# Patient Record
Sex: Male | Born: 1980
Health system: Southern US, Community
[De-identification: ages and names within clinical notes are randomized; demographics above are authoritative.]

## PROBLEM LIST (undated history)

## (undated) DIAGNOSIS — G44229 Chronic tension-type headache, not intractable: Secondary | ICD-10-CM

## (undated) DIAGNOSIS — I10 Essential (primary) hypertension: Secondary | ICD-10-CM

## (undated) HISTORY — PX: NO PAST SURGERIES: SHX2092

## (undated) HISTORY — DX: Chronic tension-type headache, not intractable: G44.229

## (undated) HISTORY — DX: Essential (primary) hypertension: I10

---

## 2004-04-06 DIAGNOSIS — G44229 Chronic tension-type headache, not intractable: Secondary | ICD-10-CM | POA: Insufficient documentation

## 2006-10-04 ENCOUNTER — Ambulatory Visit: Payer: Self-pay | Admitting: Internal Medicine

## 2008-06-07 IMAGING — CR RIGHT HAND - COMPLETE 3+ VIEW
1 series · 3 of 3 positions shown · non-contrast
Comparison: none

REASON FOR EXAM: Injury
COMMENTS:

[Series 1: view not recorded · 0.17mm/px · 3 of 3 slices shown]
[im 1/3]
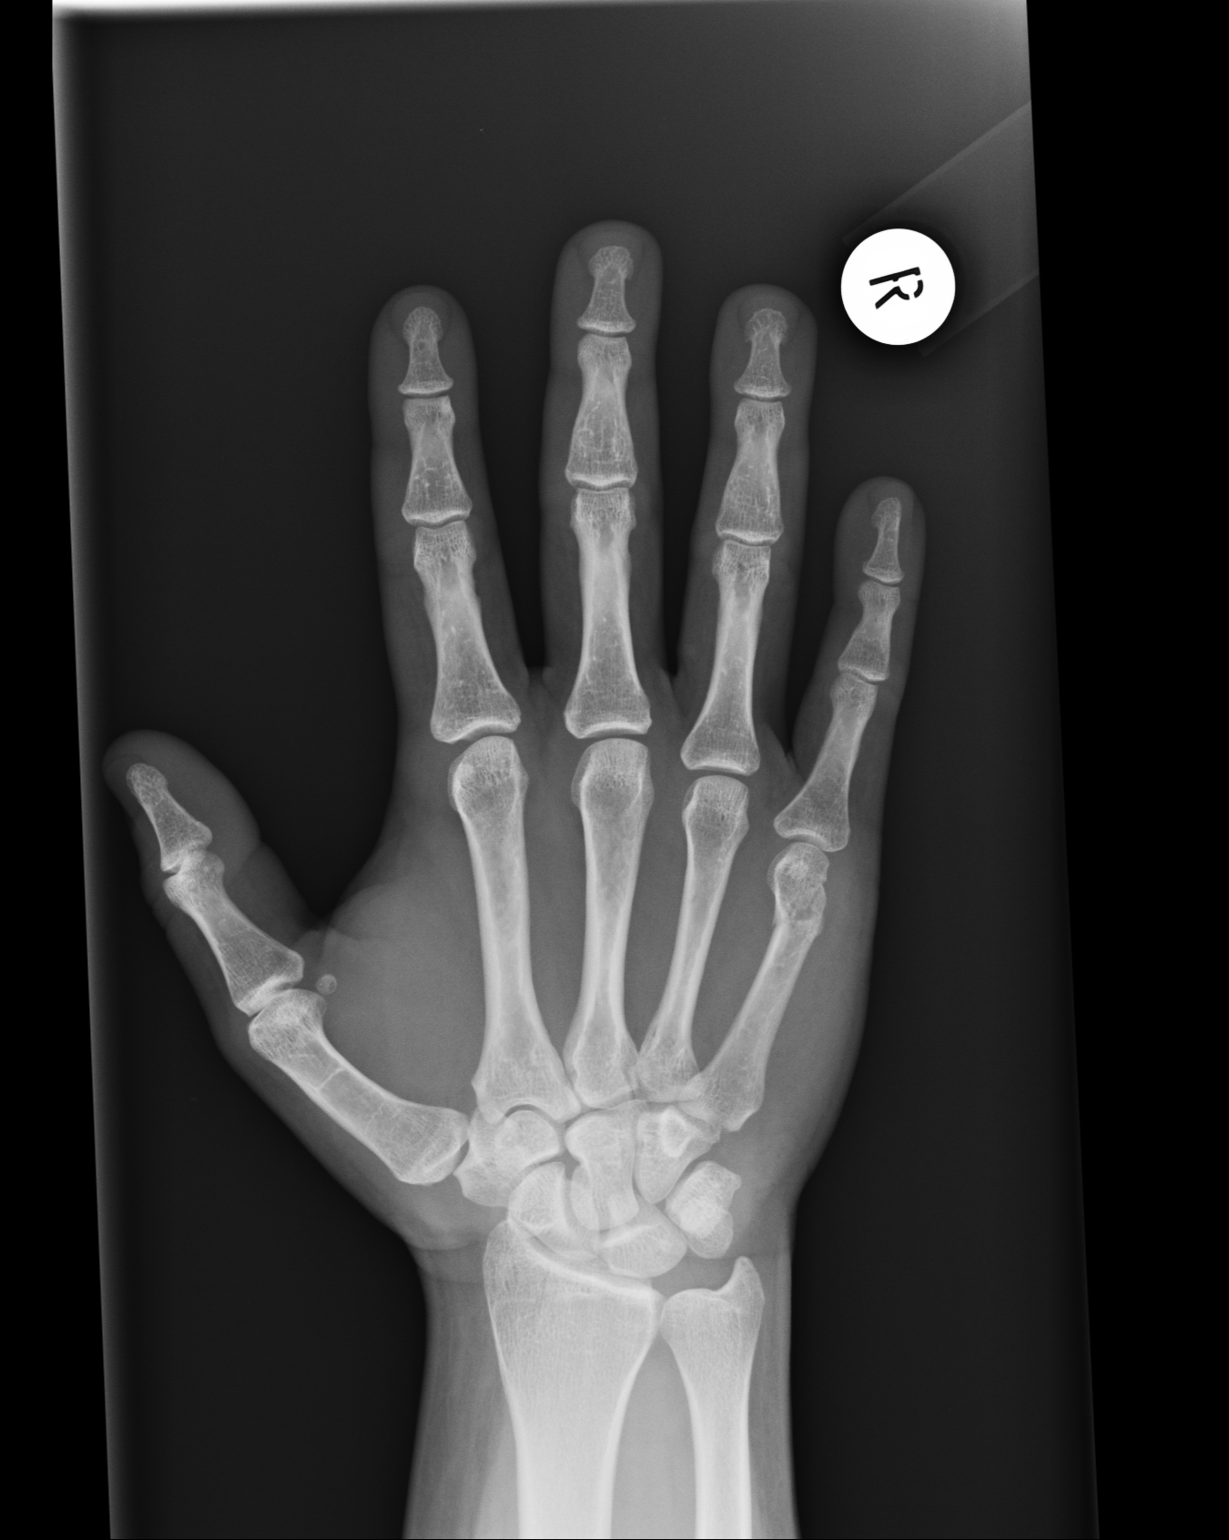
[im 2/3]
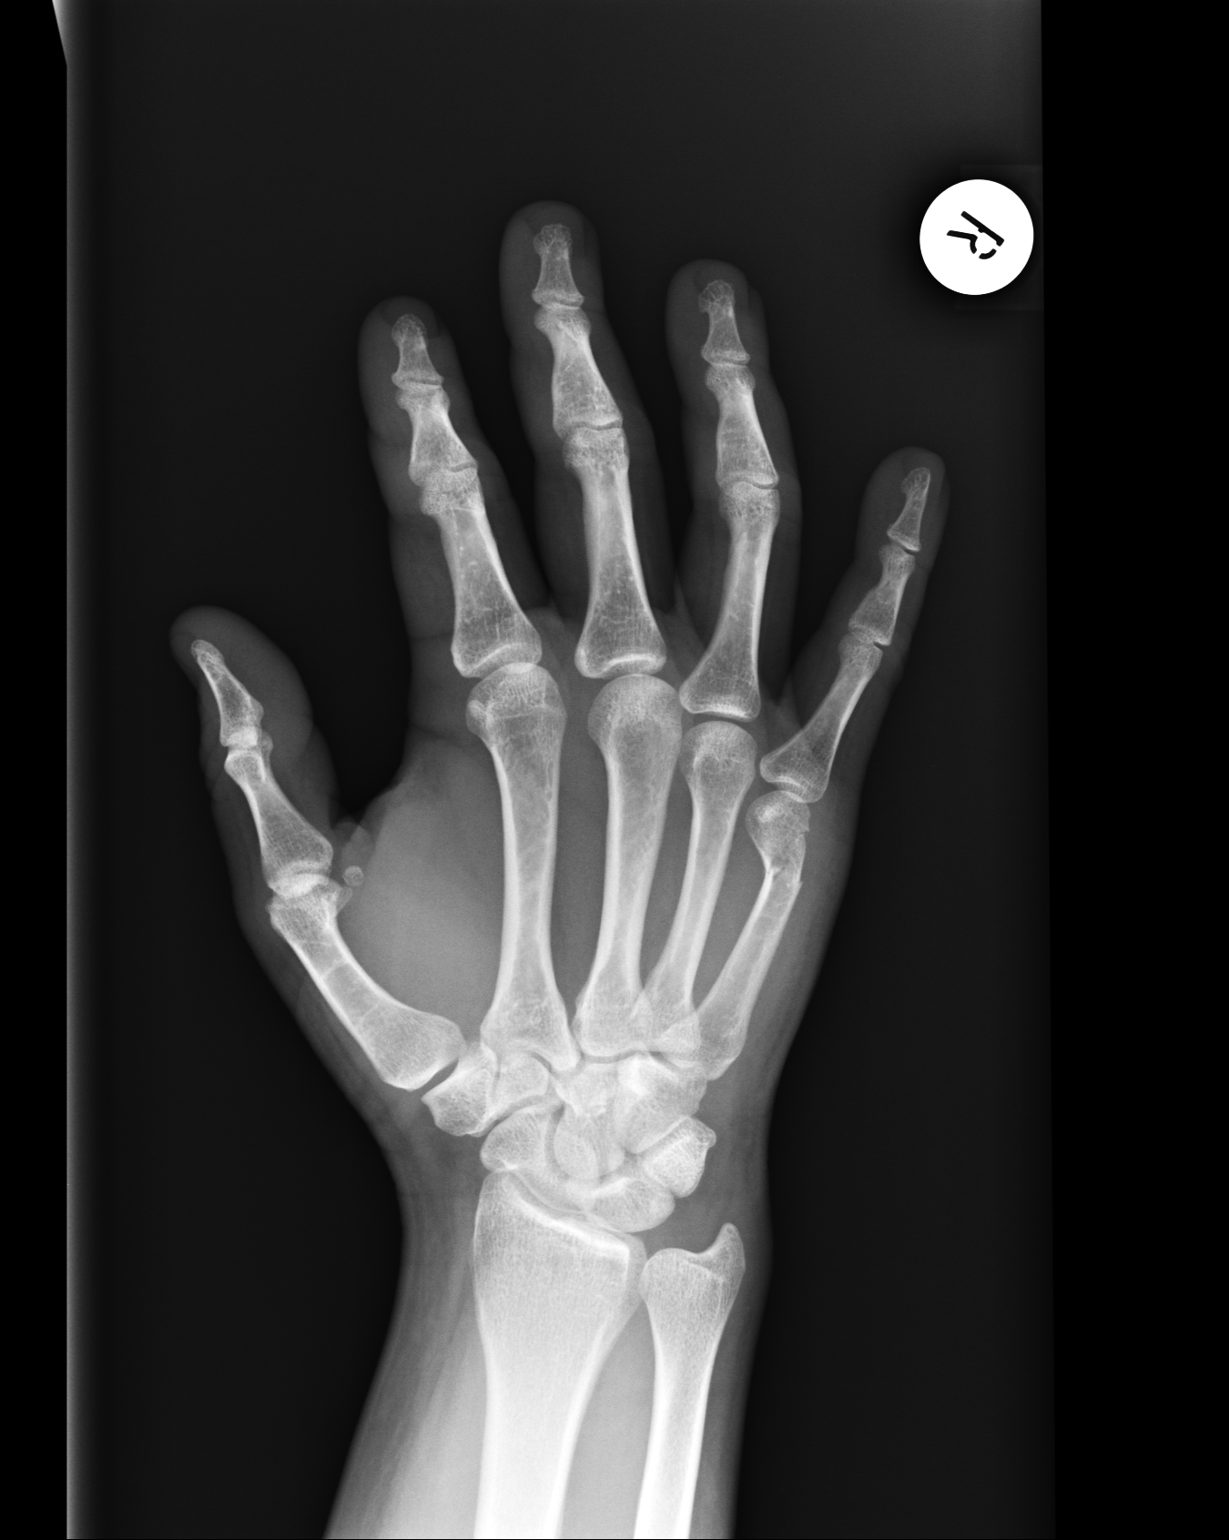
[im 3/3]
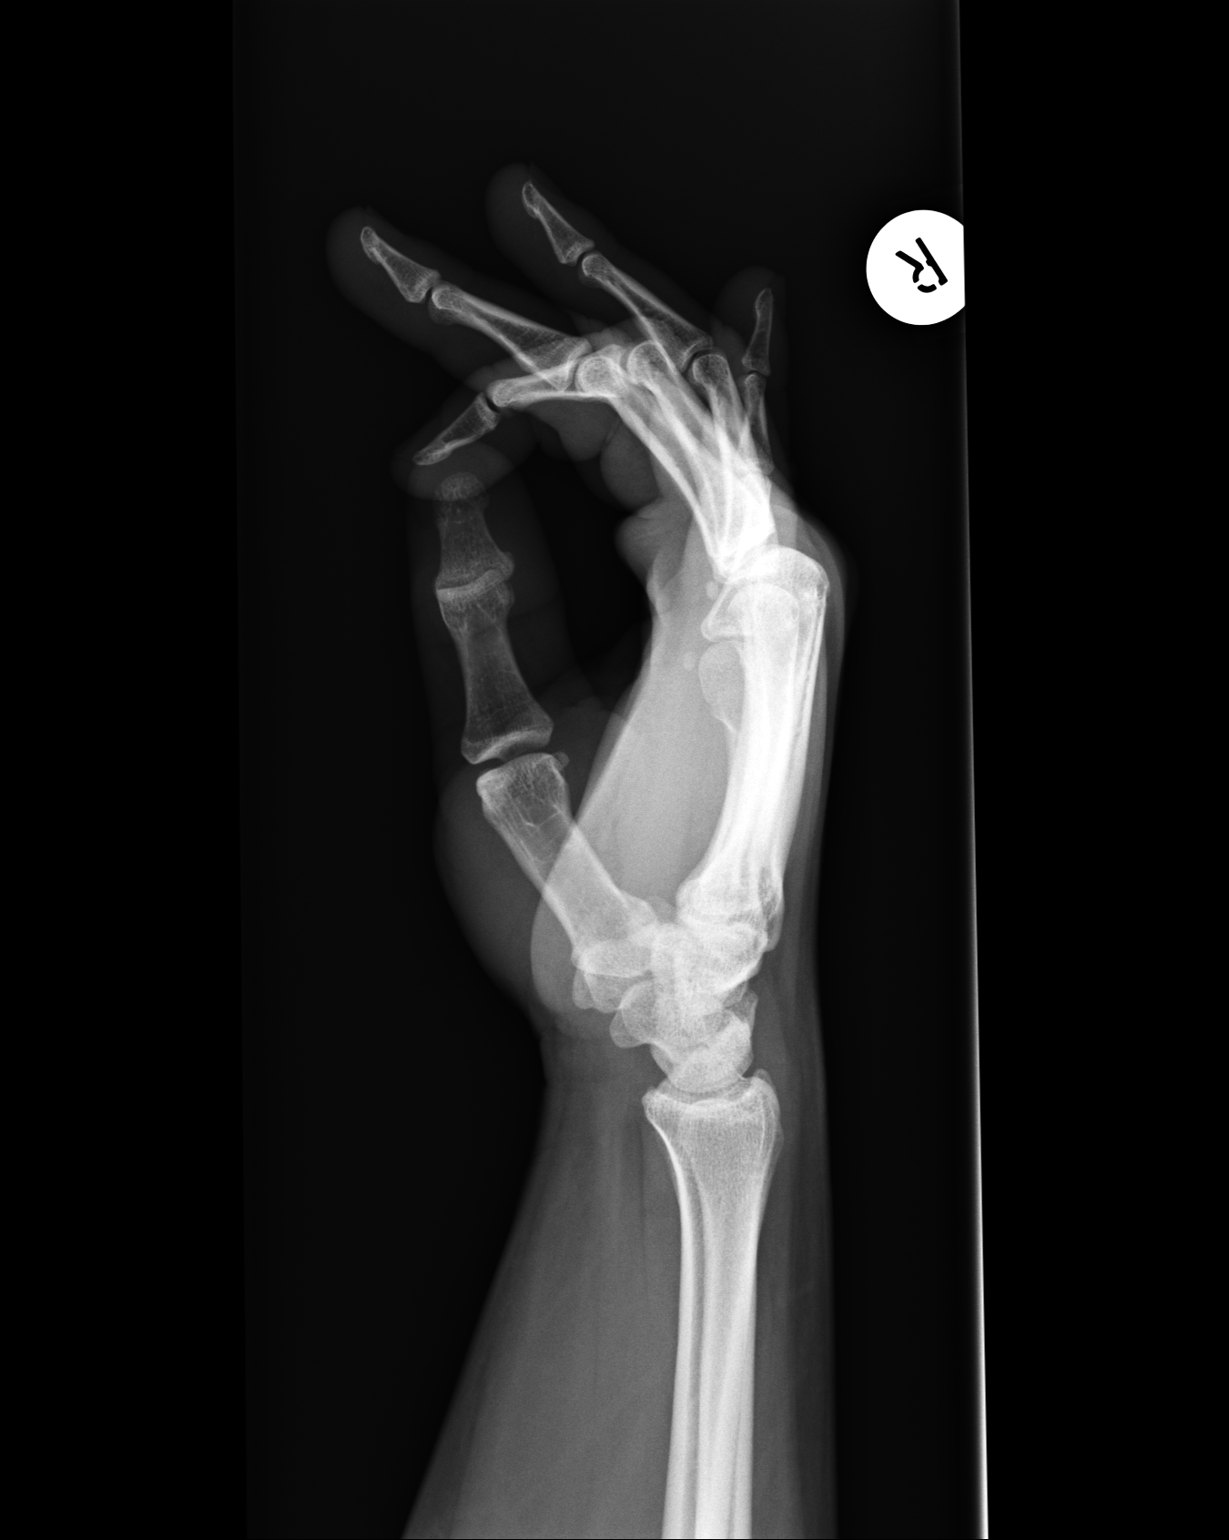

[3 of 3 positions shown; findings below may reference images not displayed]

PROCEDURE:     MDR - MDR HAND RT COMP W/OBLIQUES  - October 04, 2006  [DATE]

RESULT:     Three views were obtained. There is a minimally displaced
fracture of the distal RIGHT, fifth metacarpal. There is slight lateral and
slight volar angulation of the distal fracture component with respect to the
proximal. No other fractures are seen.
IMPRESSION: Fracture of the distal RIGHT, fifth metacarpal.

## 2008-11-02 ENCOUNTER — Ambulatory Visit: Payer: Self-pay | Admitting: Family Medicine

## 2009-09-09 DIAGNOSIS — I10 Essential (primary) hypertension: Secondary | ICD-10-CM | POA: Insufficient documentation

## 2011-04-01 ENCOUNTER — Ambulatory Visit: Payer: Self-pay | Admitting: Family Medicine

## 2014-11-10 ENCOUNTER — Other Ambulatory Visit: Payer: Self-pay | Admitting: Family Medicine

## 2015-04-16 ENCOUNTER — Other Ambulatory Visit: Payer: Self-pay | Admitting: Family Medicine

## 2015-05-21 ENCOUNTER — Other Ambulatory Visit: Payer: Self-pay | Admitting: Family Medicine

## 2015-05-24 DIAGNOSIS — R053 Chronic cough: Secondary | ICD-10-CM | POA: Insufficient documentation

## 2015-05-24 DIAGNOSIS — Z83438 Family history of other disorder of lipoprotein metabolism and other lipidemia: Secondary | ICD-10-CM | POA: Insufficient documentation

## 2015-05-24 DIAGNOSIS — R05 Cough: Secondary | ICD-10-CM | POA: Insufficient documentation

## 2015-05-27 ENCOUNTER — Ambulatory Visit (INDEPENDENT_AMBULATORY_CARE_PROVIDER_SITE_OTHER): Payer: 59 | Admitting: Family Medicine

## 2015-05-27 ENCOUNTER — Ambulatory Visit: Payer: Self-pay | Admitting: Family Medicine

## 2015-05-27 ENCOUNTER — Encounter: Payer: Self-pay | Admitting: Family Medicine

## 2015-05-27 VITALS — BP 124/94 | HR 60 | Temp 98.1°F | Resp 16 | Ht 73.0 in | Wt 316.0 lb

## 2015-05-27 DIAGNOSIS — G44229 Chronic tension-type headache, not intractable: Secondary | ICD-10-CM | POA: Diagnosis not present

## 2015-05-27 DIAGNOSIS — Z83438 Family history of other disorder of lipoprotein metabolism and other lipidemia: Secondary | ICD-10-CM

## 2015-05-27 DIAGNOSIS — I10 Essential (primary) hypertension: Secondary | ICD-10-CM

## 2015-05-27 DIAGNOSIS — Z8349 Family history of other endocrine, nutritional and metabolic diseases: Secondary | ICD-10-CM | POA: Diagnosis not present

## 2015-05-27 MED ORDER — LISINOPRIL 10 MG PO TABS: 10.0000 mg | ORAL_TABLET | Freq: Every day | ORAL | Status: DC

## 2015-05-27 MED ORDER — PROPRANOLOL HCL ER 60 MG PO CP24: 60.0000 mg | ORAL_CAPSULE | Freq: Every day | ORAL | Status: DC

## 2015-05-27 NOTE — Assessment & Plan Note (Signed)
Controlled with propranolol.

## 2015-05-27 NOTE — Progress Notes (Signed)
Subjective:     Patient ID: George Bennett, male   DOB: 01/15/1981, 35 y.o.   MRN: [id number]  HPI  Chief Complaint  Patient presents with  . Hypertension    Patient comes in office today for follow up from 04/20/14, last office visit patients condition was stable with a blood pressure in house of 118/74. 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   Review of Systems  Constitutional:       Weight is decreased 8# from prior office visit of a year ago.  Respiratory: Negative for shortness of breath.   Cardiovascular: Negative for chest pain and palpitations.       Objective:   Physical Exam  Constitutional: He appears well-developed and well-nourished. No distress.  Cardiovascular: Normal rate and regular rhythm.   Pulmonary/Chest: Breath sounds normal.  Musculoskeletal: He exhibits no edema (of lower extremities).       Assessment:    1. Benign hypertension - Comprehensive metabolic panel - lisinopril (PRINIVIL,ZESTRIL) 10 MG tablet; Take 1 tablet (10 mg total) by mouth daily.  Dispense: 30 tablet; Refill: 5 - propranolol ER (INDERAL LA) 60 MG 24 hr capsule; Take 1 capsule (60 mg total) by mouth daily.  Dispense: 30 capsule; Refill: 5  2. Family history of hyperlipidemia - Lipid panel    Plan:    Further f/u pending lab work.

## 2015-05-27 NOTE — Patient Instructions (Signed)
We will call you with the lab results. 

## 2015-05-28 ENCOUNTER — Telehealth: Payer: Self-pay

## 2015-05-28 LAB — COMPREHENSIVE METABOLIC PANEL
A/G RATIO: 1.3 (ref 1.1–2.5)
ALT: 20 IU/L (ref 0–44)
AST: 18 IU/L (ref 0–40)
Albumin: 4.3 g/dL (ref 3.5–5.5)
Alkaline Phosphatase: 91 IU/L (ref 39–117)
BILIRUBIN TOTAL: 0.7 mg/dL (ref 0.0–1.2)
BUN/Creatinine Ratio: 18 (ref 8–19)
BUN: 18 mg/dL (ref 6–20)
CALCIUM: 9.6 mg/dL (ref 8.7–10.2)
CHLORIDE: 99 mmol/L (ref 96–106)
CO2: 25 mmol/L (ref 18–29)
Creatinine, Ser: 1.02 mg/dL (ref 0.76–1.27)
GFR calc non Af Amer: 95 mL/min/{1.73_m2} (ref >59)
GFR, EST AFRICAN AMERICAN: 110 mL/min/{1.73_m2} (ref >59)
GLOBULIN, TOTAL: 3.3 g/dL (ref 1.5–4.5)
Glucose: 90 mg/dL (ref 65–99)
POTASSIUM: 4.9 mmol/L (ref 3.5–5.2)
SODIUM: 140 mmol/L (ref 134–144)
Total Protein: 7.6 g/dL (ref 6.0–8.5)

## 2015-05-28 LAB — LIPID PANEL
Chol/HDL Ratio: 2.8 ratio units (ref 0.0–5.0)
Cholesterol, Total: 148 mg/dL (ref 100–199)
HDL: 53 mg/dL (ref >39)
LDL Calculated: 79 mg/dL (ref 0–99)
Triglycerides: 81 mg/dL (ref 0–149)
VLDL Cholesterol Cal: 16 mg/dL (ref 5–40)

## 2015-05-28 NOTE — Telephone Encounter (Signed)
Unable to reach patient at this time, home line and cell number are both the same. Voicemail box is full will try contacting patient again at a later time. KW

## 2015-05-28 NOTE — Telephone Encounter (Signed)
Pt is returning call.  ZO#[phone]/[id number]

## 2015-05-28 NOTE — Telephone Encounter (Signed)
Patient has been advised. KW 

## 2015-05-28 NOTE — Telephone Encounter (Signed)
-----   Message from Anola Gurney, [place] sent at 05/28/2015  7:40 AM EST ----- Labs are excellent with a low risk cholesterol profile.

## 2015-12-21 ENCOUNTER — Ambulatory Visit (INDEPENDENT_AMBULATORY_CARE_PROVIDER_SITE_OTHER): Payer: 59 | Admitting: Family Medicine

## 2015-12-21 ENCOUNTER — Encounter: Payer: Self-pay | Admitting: Family Medicine

## 2015-12-21 VITALS — BP 132/68 | HR 78 | Temp 98.3°F | Resp 16 | Wt 321.0 lb

## 2015-12-21 DIAGNOSIS — S46311A Strain of muscle, fascia and tendon of triceps, right arm, initial encounter: Secondary | ICD-10-CM | POA: Diagnosis not present

## 2015-12-21 DIAGNOSIS — I10 Essential (primary) hypertension: Secondary | ICD-10-CM

## 2015-12-21 MED ORDER — LISINOPRIL 10 MG PO TABS: 10.0000 mg | ORAL_TABLET | Freq: Every day | ORAL | 5 refills | Status: DC

## 2015-12-21 MED ORDER — PROPRANOLOL HCL ER 60 MG PO CP24: 60.0000 mg | ORAL_CAPSULE | Freq: Every day | ORAL | 5 refills | Status: DC

## 2015-12-21 MED ORDER — MELOXICAM 15 MG PO TABS: 15.0000 mg | ORAL_TABLET | Freq: Every day | ORAL | 0 refills | Status: DC

## 2015-12-21 NOTE — Patient Instructions (Signed)
Discussed use of cold compresses for 20 minutes several x day/at the end of your work shift. Add Tylenol extra strength 500 mg. Two pills 3 x day (do not exceed 3000 mg./day) to the prescribed antiinflammatory.

## 2015-12-21 NOTE — Progress Notes (Signed)
Subjective:     Patient ID: George Bennett, male   DOB: 02/14/1981, 35 y.o.   MRN: [id number]  HPI   Review of Systems     Objective:   Physical Exam     Assessment:        Plan:

## 2015-12-21 NOTE — Progress Notes (Signed)
Subjective:     Patient ID: George Bennett, male   DOB: 07/16/1980, 35 y.o.   MRN: [id number]  HPI  Chief Complaint  Patient presents with  . Elbow Pain    Patient comes in office today with complaints of right elbow pain that began last week, patient states that he is unable to straighten his right arm. 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 States right arm started getting progressively sore last week to the point he can not extend it fully. He has continued to work despite this. Has not been taking medication. Also wishes refill on his bp medication.   Review of Systems  Respiratory: Negative for shortness of breath.   Cardiovascular: Negative for chest pain and palpitations.       Objective:   Physical Exam  Constitutional: He appears well-developed and well-nourished. No distress.  Cardiovascular: Normal rate and regular rhythm.   Pulmonary/Chest: Breath sounds normal.  Musculoskeletal: He exhibits no edema. Tenderness: of lower extremities.  Right grip/EF/EE 5/5. Tender at his elbow at insertion of his triceps tendon       Assessment:    1. Triceps strain, right, initial encounter - meloxicam (MOBIC) 15 MG tablet; Take 1 tablet (15 mg total) by mouth daily.  Dispense: 30 tablet; Refill: 0  2. Benign hypertension - lisinopril (PRINIVIL,ZESTRIL) 10 MG tablet; Take 1 tablet (10 mg total) by mouth daily.  Dispense: 30 tablet; Refill: 5 - propranolol ER (INDERAL LA) 60 MG 24 hr capsule; Take 1 capsule (60 mg total) by mouth daily.  Dispense: 30 capsule; Refill: 5     Plan:    Rest/ice and scheduled extra strength Tylenol in addition to meloxicam. He can take time off if required.

## 2016-01-17 ENCOUNTER — Other Ambulatory Visit: Payer: Self-pay | Admitting: Family Medicine

## 2016-01-17 DIAGNOSIS — S46311A Strain of muscle, fascia and tendon of triceps, right arm, initial encounter: Secondary | ICD-10-CM

## 2016-01-17 NOTE — Telephone Encounter (Signed)
Provider is out of office can you please review. KW

## 2016-06-01 DIAGNOSIS — S76192A Other specified injury of left quadriceps muscle, fascia and tendon, initial encounter: Secondary | ICD-10-CM | POA: Insufficient documentation

## 2016-06-09 DIAGNOSIS — S76912A Strain of unspecified muscles, fascia and tendons at thigh level, left thigh, initial encounter: Secondary | ICD-10-CM | POA: Insufficient documentation

## 2016-07-18 ENCOUNTER — Other Ambulatory Visit: Payer: Self-pay | Admitting: Family Medicine

## 2016-07-18 DIAGNOSIS — I10 Essential (primary) hypertension: Secondary | ICD-10-CM

## 2016-08-15 ENCOUNTER — Other Ambulatory Visit: Payer: Self-pay | Admitting: Family Medicine

## 2016-08-15 DIAGNOSIS — I10 Essential (primary) hypertension: Secondary | ICD-10-CM

## 2016-11-21 ENCOUNTER — Other Ambulatory Visit: Payer: Self-pay | Admitting: Family Medicine

## 2016-11-21 DIAGNOSIS — I10 Essential (primary) hypertension: Secondary | ICD-10-CM

## 2017-03-14 ENCOUNTER — Other Ambulatory Visit: Payer: Self-pay | Admitting: Family Medicine

## 2017-03-14 DIAGNOSIS — I10 Essential (primary) hypertension: Secondary | ICD-10-CM

## 2017-06-25 ENCOUNTER — Other Ambulatory Visit: Payer: Self-pay | Admitting: Family Medicine

## 2017-06-25 DIAGNOSIS — I10 Essential (primary) hypertension: Secondary | ICD-10-CM

## 2017-08-09 ENCOUNTER — Encounter: Payer: Self-pay | Admitting: Family Medicine

## 2017-08-09 ENCOUNTER — Ambulatory Visit: Payer: 59 | Admitting: Family Medicine

## 2017-08-09 VITALS — BP 134/92 | HR 69 | Temp 97.7°F | Resp 16 | Wt 339.4 lb

## 2017-08-09 DIAGNOSIS — Z83438 Family history of other disorder of lipoprotein metabolism and other lipidemia: Secondary | ICD-10-CM | POA: Diagnosis not present

## 2017-08-09 DIAGNOSIS — R140 Abdominal distension (gaseous): Secondary | ICD-10-CM

## 2017-08-09 DIAGNOSIS — I10 Essential (primary) hypertension: Secondary | ICD-10-CM

## 2017-08-09 DIAGNOSIS — R197 Diarrhea, unspecified: Secondary | ICD-10-CM | POA: Diagnosis not present

## 2017-08-09 MED ORDER — DICYCLOMINE HCL 20 MG PO TABS
20.0000 mg | ORAL_TABLET | Freq: Three times a day (TID) | ORAL | 1 refills | Status: DC
Start: 2017-08-09 — End: 2021-04-28

## 2017-08-09 NOTE — Progress Notes (Signed)
  Subjective:     Patient ID: George Bennett, male   DOB: 12/31/1980, 37 y.o.   MRN: [id number] Chief Complaint  Patient presents with  . Diarrhea    Patient comes in offcie today to address intermittent symptoms of abdominap pain, bloating and loose stools for the past year. Patient states that he has abdominal pain and loose stools usually after most meals, patient states that he has been having episodes at least 2x a week as of recent. Patient denies taking any otc medication.    HPI States his previous bowel pattern was once daily. Localizes discomfort to his upper abdominal area. Currently on FMLA (through 6/30) helping take care of his two premature twin daughters who are now at [facility] after transferring from [facility]. Still works as a Multimedia programmer. Also asks about weight loss surgery. His father is a patient here and has had the surgery.  Review of Systems     Objective:   Physical Exam  Constitutional: He appears well-developed and well-nourished. No distress.  Abdominal: Soft. There is no tenderness.       Assessment:    1. Postprandial bloating: FODMAP diet sheet provided  2. Diarrhea, unspecified type: Try Bentyl  3. Benign hypertension - Comprehensive metabolic panel  4. Family history of hyperlipidemia - Lipid panel  5. Morbid obesity (HCC) - Comprehensive metabolic panel - Lipid panel - T4, free - TSH    Plan:    Phone f/u in two week and after lab results. [facility]  for[facility] dietary information once labs reviewed.

## 2017-08-09 NOTE — Patient Instructions (Addendum)
Let me know how you are doing in two weeks. We will call you with the lab results.

## 2017-08-13 ENCOUNTER — Other Ambulatory Visit: Payer: Self-pay | Admitting: Family Medicine

## 2017-08-13 ENCOUNTER — Telehealth: Payer: Self-pay

## 2017-08-13 LAB — LIPID PANEL
CHOL/HDL RATIO: 3.8 ratio (ref 0.0–5.0)
Cholesterol, Total: 147 mg/dL (ref 100–199)
HDL: 39 mg/dL — AB (ref >39)
LDL Calculated: 86 mg/dL (ref 0–99)
TRIGLYCERIDES: 108 mg/dL (ref 0–149)
VLDL CHOLESTEROL CAL: 22 mg/dL (ref 5–40)

## 2017-08-13 LAB — COMPREHENSIVE METABOLIC PANEL
ALT: 27 IU/L (ref 0–44)
AST: 17 IU/L (ref 0–40)
Albumin/Globulin Ratio: 1.3 (ref 1.2–2.2)
Albumin: 4.2 g/dL (ref 3.5–5.5)
Alkaline Phosphatase: 94 IU/L (ref 39–117)
BUN/Creatinine Ratio: 19 (ref 9–20)
BUN: 19 mg/dL (ref 6–20)
Bilirubin Total: 0.6 mg/dL (ref 0.0–1.2)
CALCIUM: 9 mg/dL (ref 8.7–10.2)
CO2: 24 mmol/L (ref 20–29)
CREATININE: 1 mg/dL (ref 0.76–1.27)
Chloride: 100 mmol/L (ref 96–106)
GFR, EST AFRICAN AMERICAN: 111 mL/min/{1.73_m2} (ref >59)
GFR, EST NON AFRICAN AMERICAN: 96 mL/min/{1.73_m2} (ref >59)
GLUCOSE: 86 mg/dL (ref 65–99)
Globulin, Total: 3.2 g/dL (ref 1.5–4.5)
Potassium: 4.5 mmol/L (ref 3.5–5.2)
Sodium: 139 mmol/L (ref 134–144)
Total Protein: 7.4 g/dL (ref 6.0–8.5)

## 2017-08-13 LAB — TSH: TSH: 2.53 u[IU]/mL (ref 0.450–4.500)

## 2017-08-13 LAB — T4, FREE: Free T4: 1.31 ng/dL (ref 0.82–1.77)

## 2017-08-13 NOTE — Telephone Encounter (Signed)
-----   Message from Anola Gurney, [place] sent at 08/13/2017  7:25 AM EDT ----- Labs look great with low cholesterol and normal sugar. Do you wish a referral to nutritionist for weight loss diet and advice?

## 2017-08-13 NOTE — Telephone Encounter (Signed)
[facility] to [facility] in [facility].

## 2017-08-13 NOTE — Telephone Encounter (Signed)
Patient was advised he states that he would like referral to nutritionist.KW

## 2017-08-22 ENCOUNTER — Other Ambulatory Visit: Payer: Self-pay | Admitting: Family Medicine

## 2017-08-22 DIAGNOSIS — I10 Essential (primary) hypertension: Secondary | ICD-10-CM

## 2017-08-25 ENCOUNTER — Telehealth: Payer: Self-pay | Admitting: Family Medicine

## 2017-08-25 ENCOUNTER — Other Ambulatory Visit: Payer: Self-pay | Admitting: Family Medicine

## 2017-08-25 DIAGNOSIS — R197 Diarrhea, unspecified: Secondary | ICD-10-CM

## 2017-08-25 NOTE — Telephone Encounter (Signed)
Patient notified to have labs done for Celiac disease. Advise patient to come up to lab and get it done with our lab tech.

## 2017-08-25 NOTE — Telephone Encounter (Signed)
Pt stated that he Saw Bob for OV on 08/09/17 and was advised to call back to update how he was feeling. Pt stated that he has been taking dicyclomine (BENTYL) 20 MG tablet as directed and also changed the food he eats as Nadine Counts suggested. Pt stated that he is feeling better and symptoms have improved but he thinks it might be bread that was causing his discomfort. Please advise. Thanks TNP

## 2017-08-25 NOTE — Telephone Encounter (Signed)
I have sent in lab order to screen for gluten sensitivity (celiac disease)

## 2017-08-25 NOTE — Telephone Encounter (Signed)
Called patient back but mailbox was full.

## 2017-09-02 ENCOUNTER — Telehealth: Payer: Self-pay

## 2017-09-02 ENCOUNTER — Other Ambulatory Visit: Payer: Self-pay | Admitting: Family Medicine

## 2017-09-02 DIAGNOSIS — R197 Diarrhea, unspecified: Secondary | ICD-10-CM

## 2017-09-02 LAB — CELIAC AB TTG DGP TIGA
Antigliadin Abs, IgA: 10 units (ref 0–19)
GLIADIN IGG: 4 U (ref 0–19)
IGA/IMMUNOGLOBULIN A, SERUM: 420 mg/dL — AB (ref 90–386)
Tissue Transglut Ab: 5 U/mL (ref 0–5)

## 2017-09-02 NOTE — Telephone Encounter (Signed)
Referral in progress. 

## 2017-09-02 NOTE — Telephone Encounter (Signed)
-----   Message from Anola Gurney, [place] sent at 09/02/2017  7:22 AM EDT ----- No gluten allergy.

## 2017-09-02 NOTE — Telephone Encounter (Signed)
Patient advised he states what should he do now? Patient wants to know if he needs to come back to office for follow up? KW

## 2017-09-02 NOTE — Telephone Encounter (Signed)
Unable to reach patient voicemail box is full will try and reach patient again at a later time. KW

## 2017-09-02 NOTE — Telephone Encounter (Signed)
Patient advised he states medication is not helping please proceed with referral for G.I.. George Bennett

## 2017-09-02 NOTE — Telephone Encounter (Signed)
Continue FODMAP diet and dicyclomine as needed. If this has not helped using consistently for two weeks will send to a g.i. Specialist.

## 2017-10-25 ENCOUNTER — Ambulatory Visit: Payer: Self-pay | Admitting: Gastroenterology

## 2017-10-25 ENCOUNTER — Encounter: Payer: Self-pay | Admitting: *Deleted

## 2017-11-15 ENCOUNTER — Other Ambulatory Visit: Payer: Self-pay | Admitting: Family Medicine

## 2017-11-15 DIAGNOSIS — I10 Essential (primary) hypertension: Secondary | ICD-10-CM

## 2018-01-10 ENCOUNTER — Other Ambulatory Visit: Payer: Self-pay | Admitting: Family Medicine

## 2018-01-10 DIAGNOSIS — I10 Essential (primary) hypertension: Secondary | ICD-10-CM

## 2018-02-08 ENCOUNTER — Other Ambulatory Visit: Payer: Self-pay | Admitting: Family Medicine

## 2018-02-08 DIAGNOSIS — I10 Essential (primary) hypertension: Secondary | ICD-10-CM

## 2018-07-13 ENCOUNTER — Other Ambulatory Visit: Payer: Self-pay | Admitting: Family Medicine

## 2018-07-13 DIAGNOSIS — I10 Essential (primary) hypertension: Secondary | ICD-10-CM

## 2018-07-13 NOTE — Telephone Encounter (Signed)
Patient has not been seen since 08/09/2017. Please advise

## 2018-07-13 NOTE — Telephone Encounter (Signed)
Patient scheduled for a e-visit on 07/14/2018.

## 2018-07-13 NOTE — Telephone Encounter (Signed)
Let's offer evisit tomorrow with drive up BP check ahead of time unless he has a BP cuff at home

## 2018-07-13 NOTE — Telephone Encounter (Signed)
[facility] faxed refill request for the following medications:  lisinopril (PRINIVIL,ZESTRIL) 10 MG tablet  90 day supply  LOV: 08/09/2017 with Nadine Counts. Pt was seeing Nadine Counts and hasn't scheduled a F/U with new provider. Please advise. Thanks TNP

## 2018-07-13 NOTE — Progress Notes (Addendum)
Patient: George Bennett Male    DOB: Nov 02, 1980   38 y.o.   MRN: [id number] Visit Date: 07/14/2018  Today's Provider: Shirlee Latch, George Bennett   Chief Complaint  Patient presents with  . Hypertension   Subjective:    Virtual Visit via Video Note  I connected with George Bennett on 07/14/18 at  9:00 AM EDT by a video enabled telemedicine application and verified that I am speaking with the correct person using two identifiers.   I discussed the limitations of evaluation and management by telemedicine and the availability of in person appointments. The patient expressed understanding and agreed to proceed.   Patient location: home Provider location: home office Persons involved in the visit: patient, provider    HPI  Hypertension, follow-up:  BP Readings from Last 3 Encounters:  07/14/18 132/88  08/09/17 (!) 134/92  12/21/15 132/68    He was last seen for hypertension 1 years ago.  BP at that visit was 134/92. Management changes since that visit include no changes. He reports good compliance with treatment. He is not having side effects.  He is not exercising regularly, just walks a lot for work. He is adherent to low salt diet.   Outside blood pressures are being checked periodically. He is experiencing none.  Patient denies chest pain, chest pressure/discomfort, claudication, dyspnea, exertional chest pressure/discomfort, fatigue, irregular heart beat, lower extremity edema, near-syncope, orthopnea, palpitations, paroxysmal nocturnal dyspnea, syncope and tachypnea.   Cardiovascular risk factors include hypertension, male gender and obesity (BMI >= 30 kg/m2).  Use of agents associated with hypertension: none.     Weight trend: stable Wt Readings from Last 3 Encounters:  08/09/17 (!) 339 lb 6.4 oz (154 kg)  12/21/15 (!) 321 lb (145.6 kg)  05/27/15 (!) 316 lb (143.3 kg)    ------------------------------------------------------------------------  Allergies   Allergen Reactions  . Darvon  [Propoxyphene]   . Sulfa Antibiotics      Current Outpatient Medications:  .  dicyclomine (BENTYL) 20 MG tablet, Take 1 tablet (20 mg total) by mouth 3 (three) times daily before meals. As needed for bloating/diarrhea, Disp: 28 tablet, Rfl: 1 .  lisinopril (PRINIVIL,ZESTRIL) 10 MG tablet, Take 1 tablet (10 mg total) by mouth daily., Disp: 90 tablet, Rfl: 1 .  propranolol ER (INDERAL LA) 60 MG 24 hr capsule, Take 1 capsule (60 mg total) by mouth daily., Disp: 90 capsule, Rfl: 1  Review of Systems  Constitutional: Negative.   Respiratory: Negative.   Cardiovascular: Negative.   Musculoskeletal: Negative.     Social History   Tobacco Use  . Smoking status: Never Smoker  . Smokeless tobacco: Never Used  Substance Use Topics  . Alcohol use: No    Alcohol/week: 0.0 standard drinks      Objective:   BP 132/88 (BP Location: Left Arm)  Vitals:   07/14/18 0902  BP: 132/88     Physical Exam Vitals signs reviewed.  Constitutional:      General: He is not in acute distress.    Appearance: He is obese.  HENT:     Head: Normocephalic and atraumatic.  Pulmonary:     Effort: Pulmonary effort is normal. No respiratory distress.  Neurological:     Mental Status: He is alert and oriented to person, place, and time.  Psychiatric:        Mood and Affect: Mood normal.        Behavior: Behavior normal.         Assessment & Plan  I discussed the assessment and treatment plan with the patient. The patient was provided an opportunity to ask questions and all were answered. The patient agreed with the plan and demonstrated an understanding of the instructions.   The patient was advised to call back or seek an in-person evaluation if the symptoms worsen or if the condition fails to improve as anticipated.  Problem List Items Addressed This Visit      Cardiovascular and Mediastinum   Benign hypertension - Primary    Well controlled on reading today  Discussed goal of <140/90 Continue lisinopril and propranolol at current doses Recheck labs at next visit - as unable to get currently due to pandemic F/u in 6 months or sooner if needed      Relevant Medications   propranolol ER (INDERAL LA) 60 MG 24 hr capsule   lisinopril (PRINIVIL,ZESTRIL) 10 MG tablet       Return in about 6 months (around 01/13/2019) for CPE.   The entirety of the information documented in the History of Present Illness, Review of Systems and Physical Exam were personally obtained by me. Portions of this information were initially documented by George Bennett and George Bennett, CMA and reviewed by me for thoroughness and accuracy.    George Bennett, George Schedler Bennett, George Bennett, George Bennett [facility] 07/14/2018 9:18 AM

## 2018-07-14 ENCOUNTER — Encounter: Payer: Self-pay | Admitting: Family Medicine

## 2018-07-14 ENCOUNTER — Ambulatory Visit (INDEPENDENT_AMBULATORY_CARE_PROVIDER_SITE_OTHER): Payer: Managed Care, Other (non HMO) | Admitting: Family Medicine

## 2018-07-14 VITALS — BP 132/88

## 2018-07-14 DIAGNOSIS — I10 Essential (primary) hypertension: Secondary | ICD-10-CM

## 2018-07-14 MED ORDER — PROPRANOLOL HCL ER 60 MG PO CP24: 60.0000 mg | ORAL_CAPSULE | Freq: Every day | ORAL | 1 refills | Status: DC

## 2018-07-14 MED ORDER — LISINOPRIL 10 MG PO TABS: 10.0000 mg | ORAL_TABLET | Freq: Every day | ORAL | 1 refills | Status: DC

## 2018-07-14 NOTE — Assessment & Plan Note (Signed)
Well controlled on reading today Discussed goal of <140/90 Continue lisinopril and propranolol at current doses Recheck labs at next visit - as unable to get currently due to pandemic F/u in 6 months or sooner if needed

## 2018-11-14 ENCOUNTER — Ambulatory Visit (INDEPENDENT_AMBULATORY_CARE_PROVIDER_SITE_OTHER): Payer: Managed Care, Other (non HMO) | Admitting: Family Medicine

## 2018-11-14 ENCOUNTER — Other Ambulatory Visit: Payer: Self-pay

## 2018-11-14 ENCOUNTER — Encounter: Payer: Self-pay | Admitting: Family Medicine

## 2018-11-14 VITALS — BP 116/66 | HR 74 | Temp 98.8°F | Ht 72.0 in | Wt 332.5 lb

## 2018-11-14 DIAGNOSIS — H02823 Cysts of right eye, unspecified eyelid: Secondary | ICD-10-CM | POA: Insufficient documentation

## 2018-11-14 DIAGNOSIS — Z83438 Family history of other disorder of lipoprotein metabolism and other lipidemia: Secondary | ICD-10-CM | POA: Diagnosis not present

## 2018-11-14 DIAGNOSIS — Z0000 Encounter for general adult medical examination without abnormal findings: Secondary | ICD-10-CM

## 2018-11-14 DIAGNOSIS — H02826 Cysts of left eye, unspecified eyelid: Secondary | ICD-10-CM | POA: Insufficient documentation

## 2018-11-14 DIAGNOSIS — I10 Essential (primary) hypertension: Secondary | ICD-10-CM | POA: Diagnosis not present

## 2018-11-14 DIAGNOSIS — Z114 Encounter for screening for human immunodeficiency virus [HIV]: Secondary | ICD-10-CM

## 2018-11-14 MED ORDER — OLOPATADINE HCL 0.2 % OP SOLN: 1.0000 [drp] | Freq: Every day | OPHTHALMIC | 3 refills | Status: DC

## 2018-11-14 NOTE — Progress Notes (Signed)
Patient: George Bennett, Male    DOB: 02-13-1981, 38 y.o.   MRN: [id number] Visit Date: 11/14/2018  Today's Provider: Lavon Paganini, MD   Chief Complaint  Patient presents with  . Annual Exam   Subjective:    I, George Bennett, CMA, am acting as a scribe for George Paganini, MD.    Annual physical exam George Bennett is a 38 y.o. male who presents today for health maintenance and complete physical. He feels well. He reports no regular exercise. He reports he is sleeping well.  ----------------------------------------------------------------- Bumps on R eyelid Present for ~1 yr More aggravating and itchy now No previous treatment  Review of Systems  Constitutional: Negative.   HENT: Negative.   Eyes: Negative.   Respiratory: Negative.   Cardiovascular: Negative.   Gastrointestinal: Negative.   Endocrine: Negative.   Genitourinary: Negative.   Musculoskeletal: Negative.   Skin: Negative.   Allergic/Immunologic: Negative.   Neurological: Negative.   Hematological: Negative.   Psychiatric/Behavioral: Negative.     Social History      He  reports that he has never smoked. He has never used smokeless tobacco. He reports that he does not drink alcohol or use drugs.       Social History   Socioeconomic History  . Marital status: Single    Spouse name: Not on file  . Number of children: Not on file  . Years of education: Not on file  . Highest education level: Not on file  Occupational History  . Not on file  Social Needs  . Financial resource strain: Not on file  . Food insecurity    Worry: Not on file    Inability: Not on file  . Transportation needs    Medical: Not on file    Non-medical: Not on file  Tobacco Use  . Smoking status: Never Smoker  . Smokeless tobacco: Never Used  Substance and Sexual Activity  . Alcohol use: No    Alcohol/week: 0.0 standard drinks  . Drug use: No  . Sexual activity: Not on file  Lifestyle  . Physical  activity    Days per week: Not on file    Minutes per session: Not on file  . Stress: Not on file  Relationships  . Social Herbalist on phone: Not on file    Gets together: Not on file    Attends religious service: Not on file    Active member of club or organization: Not on file    Attends meetings of clubs or organizations: Not on file    Relationship status: Not on file  Other Topics Concern  . Not on file  Social History Narrative  . Not on file    Past Medical History:  Diagnosis Date  . Chronic tension type headache   . Hypertension      Patient Active Problem List   Diagnosis Date Noted  . Morbid obesity ([place]) 11/14/2018  . Milia of eyelids of both eyes 11/14/2018  . Family history of hyperlipidemia 05/24/2015  . Benign hypertension 09/09/2009  . Chronic tension-type headache, not intractable 04/06/2004    Past Surgical History:  Procedure Laterality Date  . NO PAST SURGERIES      Family History        Family Status  Relation Name Status  . Mother  Alive  . Father  Alive        His family history includes Diabetes in his father.  Allergies  Allergen Reactions  . Darvon  [Propoxyphene]   . Sulfa Antibiotics      Current Outpatient Medications:  .  dicyclomine (BENTYL) 20 MG tablet, Take 1 tablet (20 mg total) by mouth 3 (three) times daily before meals. As needed for bloating/diarrhea, Disp: 28 tablet, Rfl: 1 .  lisinopril (PRINIVIL,ZESTRIL) 10 MG tablet, Take 1 tablet (10 mg total) by mouth daily., Disp: 90 tablet, Rfl: 1 .  propranolol ER (INDERAL LA) 60 MG 24 hr capsule, Take 1 capsule (60 mg total) by mouth daily., Disp: 90 capsule, Rfl: 1 .  Olopatadine HCl 0.2 % SOLN, Apply 1 drop to eye daily., Disp: 2.5 mL, Rfl: 3   Patient Care Team: George DownerBacigalupo, George Coven M, MD as PCP - General (Family Medicine)    Objective:    Vitals: BP 116/66 (BP Location: Right Arm, Patient Position: Sitting, Cuff Size: Large)   Pulse 74   Temp 98.8  F (37.1 C) (Oral)   Ht 6' (1.829 Bennett)   Wt (!) 332 lb 8 oz (150.8 kg)   SpO2 96%   BMI 45.10 kg/Bennett    Vitals:   11/14/18 1333 11/14/18 1358  BP: (!) 145/83 116/66  Pulse: 74   Temp: 98.8 F (37.1 C)   TempSrc: Oral   SpO2: 96%   Weight: (!) 332 lb 8 oz (150.8 kg)   Height: 6' (1.829 Bennett)      Physical Exam Vitals signs reviewed.  Constitutional:      General: He is not in acute distress.    Appearance: Normal appearance. He is well-developed. He is not diaphoretic.  HENT:     Head: Normocephalic and atraumatic.     Right Ear: Tympanic membrane, ear canal and external ear normal.     Left Ear: Tympanic membrane, ear canal and external ear normal.  Eyes:     General: No scleral icterus.    Conjunctiva/sclera: Conjunctivae normal.     Pupils: Pupils are equal, round, and reactive to light.     Comments: Milia of bilateral eyelids present  Neck:     Musculoskeletal: Neck supple.     Thyroid: No thyromegaly.  Cardiovascular:     Rate and Rhythm: Normal rate and regular rhythm.     Pulses: Normal pulses.     Heart sounds: Normal heart sounds. No murmur.  Pulmonary:     Effort: Pulmonary effort is normal. No respiratory distress.     Breath sounds: Normal breath sounds. No wheezing or rales.  Abdominal:     General: There is no distension.     Palpations: Abdomen is soft.     Tenderness: There is no abdominal tenderness.  Musculoskeletal:        General: No deformity.     Right lower leg: No edema.     Left lower leg: No edema.  Lymphadenopathy:     Cervical: No cervical adenopathy.  Skin:    General: Skin is warm and dry.     Capillary Refill: Capillary refill takes less than 2 seconds.     Findings: No rash.  Neurological:     Mental Status: He is alert and oriented to person, place, and time. Mental status is at baseline.  Psychiatric:        Mood and Affect: Mood normal.        Behavior: Behavior normal.        Thought Content: Thought content normal.     Depression Screen PHQ 2/9 Scores 11/14/2018  PHQ -  2 Score 0  PHQ- 9 Score 0      Assessment & Plan:     Routine Health Maintenance and Physical Exam  Exercise Activities and Dietary recommendations Goals   None     Immunization History  Administered Date(s) Administered  . Tdap 04/20/2014    Health Maintenance  Topic Date Due  . HIV Screening  05/10/1995  . INFLUENZA VACCINE  11/05/2018  . TETANUS/TDAP  04/20/2024     Discussed health benefits of physical activity, and encouraged him to engage in regular exercise appropriate for his age and condition.    --------------------------------------------------------------------  Problem List Items Addressed This Visit      Cardiovascular and Mediastinum   Benign hypertension    Initially elevated, but well controlled on manual recheck Continue current meds Recheck metabolic panel and lipids F/u in 6 months      Relevant Orders   Comprehensive metabolic panel   Lipid panel     Musculoskeletal and Integument   Milia of eyelids of both eyes    Likely represent some allergic eye symptoms Discussed OTC antihistamine Will try pataday eye drops        Other   Family history of hyperlipidemia   Relevant Orders   Comprehensive metabolic panel   Lipid panel   Morbid obesity (HCC)    Discussed importance of healthy weight management Discussed diet and exercise       Relevant Orders   Comprehensive metabolic panel   Lipid panel    Other Visit Diagnoses    Encounter for annual physical exam    -  Primary   Screening for HIV (human immunodeficiency virus)       Relevant Orders   HIV antibody (with reflex)       Return in about 6 months (around 05/17/2019) for chronic disease f/u.   The entirety of the information documented in the History of Present Illness, Review of Systems and Physical Exam were personally obtained by me. Portions of this information were initially documented by Presley RaddleNikki Walston, CMA and  reviewed by me for thoroughness and accuracy.    Chizaram Latino, Marzella SchleinAngela M, MD MPH [facility]

## 2018-11-14 NOTE — Assessment & Plan Note (Signed)
Likely represent some allergic eye symptoms Discussed OTC antihistamine Will try pataday eye drops

## 2018-11-14 NOTE — Patient Instructions (Signed)
Preventive Care 19-38 Years Old, Male Preventive care refers to lifestyle choices and visits with your health care provider that can promote health and wellness. This includes:  A yearly physical exam. This is also called an annual well check.  Regular dental and eye exams.  Immunizations.  Screening for certain conditions.  Healthy lifestyle choices, such as eating a healthy diet, getting regular exercise, not using drugs or products that contain nicotine and tobacco, and limiting alcohol use. What can I expect for my preventive care visit? Physical exam Your health care provider will check:  Height and weight. These may be used to calculate body mass index (BMI), which is a measurement that tells if you are at a healthy weight.  Heart rate and blood pressure.  Your skin for abnormal spots. Counseling Your health care provider may ask you questions about:  Alcohol, tobacco, and drug use.  Emotional well-being.  Home and relationship well-being.  Sexual activity.  Eating habits.  Work and work Statistician. What immunizations do I need?  Influenza (flu) vaccine  This is recommended every year. Tetanus, diphtheria, and pertussis (Tdap) vaccine  You may need a Td booster every 10 years. Varicella (chickenpox) vaccine  You may need this vaccine if you have not already been vaccinated. Human papillomavirus (HPV) vaccine  If recommended by your health care provider, you may need three doses over 6 months. Measles, mumps, and rubella (MMR) vaccine  You may need at least one dose of MMR. You may also need a second dose. Meningococcal conjugate (MenACWY) vaccine  One dose is recommended if you are 45-76 years old and a Market researcher living in a residence hall, or if you have one of several medical conditions. You may also need additional booster doses. Pneumococcal conjugate (PCV13) vaccine  You may need this if you have certain conditions and were not  previously vaccinated. Pneumococcal polysaccharide (PPSV23) vaccine  You may need one or two doses if you smoke cigarettes or if you have certain conditions. Hepatitis A vaccine  You may need this if you have certain conditions or if you travel or work in places where you may be exposed to hepatitis A. Hepatitis B vaccine  You may need this if you have certain conditions or if you travel or work in places where you may be exposed to hepatitis B. Haemophilus influenzae type b (Hib) vaccine  You may need this if you have certain risk factors. You may receive vaccines as individual doses or as more than one vaccine together in one shot (combination vaccines). Talk with your health care provider about the risks and benefits of combination vaccines. What tests do I need? Blood tests  Lipid and cholesterol levels. These may be checked every 5 years starting at age 17.  Hepatitis C test.  Hepatitis B test. Screening   Diabetes screening. This is done by checking your blood sugar (glucose) after you have not eaten for a while (fasting).  Sexually transmitted disease (STD) testing. Talk with your health care provider about your test results, treatment options, and if necessary, the need for more tests. Follow these instructions at home: Eating and drinking   Eat a diet that includes fresh fruits and vegetables, whole grains, lean protein, and low-fat dairy products.  Take vitamin and mineral supplements as recommended by your health care provider.  Do not drink alcohol if your health care provider tells you not to drink.  If you drink alcohol: ? Limit how much you have to 0-2  drinks a day. ? Be aware of how much alcohol is in your drink. In the [place] one drink equals one 12 oz bottle of beer (355 mL), one 5 oz glass of wine (148 mL), or one 1 oz glass of hard liquor (44 mL). Lifestyle  Take daily care of your teeth and gums.  Stay active. Exercise for at least 30 minutes on 5 or  more days each week.  Do not use any products that contain nicotine or tobacco, such as cigarettes, e-cigarettes, and chewing tobacco. If you need help quitting, ask your health care provider.  If you are sexually active, practice safe sex. Use a condom or other form of protection to prevent STIs (sexually transmitted infections). What's next?  Go to your health care provider once a year for a well check visit.  Ask your health care provider how often you should have your eyes and teeth checked.  Stay up to date on all vaccines. This information is not intended to replace advice given to you by your health care provider. Make sure you discuss any questions you have with your health care provider. Document Released: 05/19/2001 Document Revised: 03/17/2018 Document Reviewed: 03/17/2018 Elsevier Patient Education  2020 Elsevier Inc.  

## 2018-11-14 NOTE — Assessment & Plan Note (Signed)
Discussed importance of healthy weight management Discussed diet and exercise  

## 2018-11-14 NOTE — Assessment & Plan Note (Signed)
Initially elevated, but well controlled on manual recheck Continue current meds Recheck metabolic panel and lipids F/u in 6 months

## 2018-11-15 ENCOUNTER — Telehealth: Payer: Self-pay

## 2018-11-15 LAB — COMPREHENSIVE METABOLIC PANEL
ALT: 25 IU/L (ref 0–44)
AST: 22 IU/L (ref 0–40)
Albumin/Globulin Ratio: 1.4 (ref 1.2–2.2)
Albumin: 4.1 g/dL (ref 4.0–5.0)
Alkaline Phosphatase: 90 IU/L (ref 39–117)
BUN/Creatinine Ratio: 17 (ref 9–20)
BUN: 18 mg/dL (ref 6–20)
Bilirubin Total: 0.4 mg/dL (ref 0.0–1.2)
CO2: 25 mmol/L (ref 20–29)
Calcium: 9.1 mg/dL (ref 8.7–10.2)
Chloride: 102 mmol/L (ref 96–106)
Creatinine, Ser: 1.06 mg/dL (ref 0.76–1.27)
GFR calc Af Amer: 102 mL/min/{1.73_m2} (ref >59)
GFR calc non Af Amer: 89 mL/min/{1.73_m2} (ref >59)
Globulin, Total: 3 g/dL (ref 1.5–4.5)
Glucose: 97 mg/dL (ref 65–99)
Potassium: 4.1 mmol/L (ref 3.5–5.2)
Sodium: 140 mmol/L (ref 134–144)
Total Protein: 7.1 g/dL (ref 6.0–8.5)

## 2018-11-15 LAB — LIPID PANEL
Chol/HDL Ratio: 3.7 ratio (ref 0.0–5.0)
Cholesterol, Total: 143 mg/dL (ref 100–199)
HDL: 39 mg/dL — ABNORMAL LOW (ref >39)
LDL Calculated: 59 mg/dL (ref 0–99)
Triglycerides: 224 mg/dL — ABNORMAL HIGH (ref 0–149)
VLDL Cholesterol Cal: 45 mg/dL — ABNORMAL HIGH (ref 5–40)

## 2018-11-15 LAB — HIV ANTIBODY (ROUTINE TESTING W REFLEX): HIV Screen 4th Generation wRfx: NONREACTIVE

## 2018-11-15 NOTE — Telephone Encounter (Signed)
Attempted to contact patient, no answer and voicemail is full.  

## 2018-11-15 NOTE — Telephone Encounter (Signed)
-----   Message from Virginia Crews, MD sent at 11/15/2018  8:13 AM EDT ----- Normal labs

## 2018-11-18 NOTE — Telephone Encounter (Signed)
Patient notified of lab results

## 2019-03-11 ENCOUNTER — Other Ambulatory Visit: Payer: Self-pay | Admitting: Family Medicine

## 2019-03-11 DIAGNOSIS — I10 Essential (primary) hypertension: Secondary | ICD-10-CM

## 2019-07-15 ENCOUNTER — Other Ambulatory Visit: Payer: Self-pay | Admitting: Family Medicine

## 2019-07-15 DIAGNOSIS — I10 Essential (primary) hypertension: Secondary | ICD-10-CM

## 2019-07-15 NOTE — Telephone Encounter (Signed)
Requested  medications are  due for refill today yes  Requested medications are on the active medication list yes  Last refill 1/14  Future visit scheduled yes, august  Notes to clinic Last note states see in 6 months, appt made for august which is one year

## 2019-11-10 ENCOUNTER — Ambulatory Visit (INDEPENDENT_AMBULATORY_CARE_PROVIDER_SITE_OTHER): Payer: Managed Care, Other (non HMO) | Admitting: Physician Assistant

## 2019-11-10 ENCOUNTER — Other Ambulatory Visit: Payer: Self-pay

## 2019-11-10 ENCOUNTER — Encounter: Payer: Self-pay | Admitting: Physician Assistant

## 2019-11-10 VITALS — BP 132/84 | HR 82 | Temp 98.0°F | Wt 327.8 lb

## 2019-11-10 DIAGNOSIS — I10 Essential (primary) hypertension: Secondary | ICD-10-CM

## 2019-11-10 MED ORDER — PROPRANOLOL HCL ER 60 MG PO CP24: ORAL_CAPSULE | ORAL | 3 refills | Status: DC

## 2019-11-10 MED ORDER — LISINOPRIL 10 MG PO TABS: 10.0000 mg | ORAL_TABLET | Freq: Every day | ORAL | 3 refills | Status: DC

## 2019-11-10 NOTE — Patient Instructions (Signed)

## 2019-11-10 NOTE — Progress Notes (Signed)
Established patient visit   Patient: George Bennett   DOB: 1981-02-25   39 y.o. Male  MRN: [id number] Visit Date: 11/10/2019  Today's healthcare provider: Trey Sailors, PA-C   Chief Complaint  Patient presents with  . Hypertension  I,Porsha C McClurkin,acting as a scribe for [employer], PA-C.,have documented all relevant documentation on the behalf of Trey Sailors, PA-C,as directed by  Trey Sailors, PA-C while in the presence of Trey Sailors, PA-C.  Subjective    HPI  Hypertension, follow-up  BP Readings from Last 3 Encounters:  11/10/19 132/84  11/14/18 116/66  07/14/18 132/88   Wt Readings from Last 3 Encounters:  11/10/19 (!) 327 lb 12.8 oz (148.7 kg)  11/14/18 (!) 332 lb 8 oz (150.8 kg)  08/09/17 (!) 339 lb 6.4 oz (154 kg)     He was last seen for hypertension 1 years ago.  BP at that visit was 116/66. Management since that visit includes no changes.  He reports good compliance with treatment. He is not having side effects.  He is following a Regular diet. He is not exercising. He does not smoke.  Use of agents associated with hypertension: none.   Outside blood pressures are not being checked. Symptoms: No chest pain No chest pressure  No palpitations No syncope  No dyspnea No orthopnea  No paroxysmal nocturnal dyspnea No lower extremity edema   Pertinent labs: Lab Results  Component Value Date   CHOL 143 11/14/2018   HDL 39 (L) 11/14/2018   LDLCALC 59 11/14/2018   TRIG 224 (H) 11/14/2018   CHOLHDL 3.7 11/14/2018   Lab Results  Component Value Date   NA 140 11/14/2018   K 4.1 11/14/2018   CREATININE 1.06 11/14/2018   GFRNONAA 89 11/14/2018   GFRAA 102 11/14/2018   GLUCOSE 97 11/14/2018     The ASCVD Risk score (Goff DC Jr., et al., 2013) failed to calculate for the following reasons:   The 2013 ASCVD risk score is only valid for ages 46 to 62    ---------------------------------------------------------------------------------------------------      Medications: Outpatient Medications Prior to Visit  Medication Sig  . lisinopril (ZESTRIL) 10 MG tablet TAKE 1 TABLET BY MOUTH EVERY DAY  . propranolol ER (INDERAL LA) 60 MG 24 hr capsule TAKE 1 CAPSULE BY MOUTH EVERY DAY  . dicyclomine (BENTYL) 20 MG tablet Take 1 tablet (20 mg total) by mouth 3 (three) times daily before meals. As needed for bloating/diarrhea (Patient not taking: Reported on 11/10/2019)  . Olopatadine HCl 0.2 % SOLN Apply 1 drop to eye daily. (Patient not taking: Reported on 11/10/2019)   No facility-administered medications prior to visit.    Review of Systems  Constitutional: Negative.   Respiratory: Negative.   Cardiovascular: Negative.   Hematological: Negative.       Objective    BP 132/84 (BP Location: Left Arm, Patient Position: Sitting, Cuff Size: Large)   Pulse 82   Temp 98 F (36.7 C) (Oral)   Wt (!) 327 lb 12.8 oz (148.7 kg)   SpO2 96%   BMI 44.46 kg/m    Physical Exam Constitutional:      Appearance: Normal appearance. He is obese.  Cardiovascular:     Rate and Rhythm: Normal rate and regular rhythm.     Pulses: Normal pulses.     Heart sounds: Normal heart sounds.  Pulmonary:     Effort: Pulmonary effort is normal.     Breath sounds:  Normal breath sounds.  Skin:    General: Skin is warm and dry.  Neurological:     General: No focal deficit present.     Mental Status: He is alert and oriented to person, place, and time.  Psychiatric:        Mood and Affect: Mood normal.        Behavior: Behavior normal.       No results found for any visits on 11/10/19.  Assessment & Plan    1. Benign hypertension Well controlled Continue current medications Recheck metabolic panel  - Lipid panel - Comprehensive metabolic panel - lisinopril (ZESTRIL) 10 MG tablet; Take 1 tablet (10 mg total) by mouth daily.  Dispense: 90 tablet;  Refill: 3 - propranolol ER (INDERAL LA) 60 MG 24 hr capsule; TAKE 1 CAPSULE BY MOUTH EVERY DAY  Dispense: 90 capsule; Refill: 3   No follow-ups on file.      ITrey Sailors, PA-C, have reviewed all documentation for this visit. The documentation on 12/05/19 for the exam, diagnosis, procedures, and orders are all accurate and complete.  The entirety of the information documented in the History of Present Illness, Review of Systems and Physical Exam were personally obtained by me. Portions of this information were initially documented by [facility] and reviewed by me for thoroughness and accuracy.     Maryella Shivers  [facility] [phone] (phone) [phone] (fax)  [facility]

## 2019-11-11 LAB — COMPREHENSIVE METABOLIC PANEL
ALT: 27 IU/L (ref 0–44)
AST: 20 IU/L (ref 0–40)
Albumin/Globulin Ratio: 1.5 (ref 1.2–2.2)
Albumin: 4.3 g/dL (ref 4.0–5.0)
Alkaline Phosphatase: 111 IU/L (ref 48–121)
BUN/Creatinine Ratio: 18 (ref 9–20)
BUN: 18 mg/dL (ref 6–20)
Bilirubin Total: 0.5 mg/dL (ref 0.0–1.2)
CO2: 23 mmol/L (ref 20–29)
Calcium: 9.1 mg/dL (ref 8.7–10.2)
Chloride: 101 mmol/L (ref 96–106)
Creatinine, Ser: 1 mg/dL (ref 0.76–1.27)
GFR calc Af Amer: 109 mL/min/{1.73_m2} (ref >59)
GFR calc non Af Amer: 94 mL/min/{1.73_m2} (ref >59)
Globulin, Total: 2.9 g/dL (ref 1.5–4.5)
Glucose: 119 mg/dL — ABNORMAL HIGH (ref 65–99)
Potassium: 3.8 mmol/L (ref 3.5–5.2)
Sodium: 138 mmol/L (ref 134–144)
Total Protein: 7.2 g/dL (ref 6.0–8.5)

## 2019-11-11 LAB — LIPID PANEL
Chol/HDL Ratio: 3.9 ratio (ref 0.0–5.0)
Cholesterol, Total: 154 mg/dL (ref 100–199)
HDL: 39 mg/dL — ABNORMAL LOW (ref >39)
LDL Chol Calc (NIH): 81 mg/dL (ref 0–99)
Triglycerides: 202 mg/dL — ABNORMAL HIGH (ref 0–149)
VLDL Cholesterol Cal: 34 mg/dL (ref 5–40)

## 2019-11-14 ENCOUNTER — Telehealth: Payer: Self-pay

## 2019-11-14 NOTE — Telephone Encounter (Signed)
Patient was advised and states "thank you" 

## 2019-11-14 NOTE — Telephone Encounter (Signed)
-----   Message from Trey Sailors, [place] sent at 11/13/2019  4:28 PM EDT ----- Labs stable. Sugar just slightly high but this was not fasting.

## 2019-11-20 ENCOUNTER — Encounter: Payer: Managed Care, Other (non HMO) | Admitting: Family Medicine

## 2020-05-07 ENCOUNTER — Ambulatory Visit: Payer: Self-pay | Admitting: Family Medicine

## 2020-05-07 NOTE — Telephone Encounter (Signed)
Pt reports tested positive covid this AM, home test. States household exposure. Reports  headache and dizziness, onset this AM. Rates headache 7/10, taking IBU, ineffective. States dizziness occurs only when going from sitting to standing, resolves quickly. Denies fever, mild cough, non-productive. Home care advise given per covid protocol, reviewed isolation guidelines. H/O HTN, advised to monitor BP if taking IBU, try Tylenol ES. Also given home care advise in regards to dizziness. Advised to CB if symptoms worsen, symptoms that warrant an ED eval. Advised I would route to practice for Dr. Nancy Nordmann review and if any additional recommendations he would hear from practice. Pt verbalizes understanding.   Reason for Disposition . [1] [id number] diagnosed by positive lab test (e.g., PCR, rapid self-test kit) AND [2] mild symptoms (e.g., cough, fever, others) AND [1] no complications or SOB  Answer Assessment - Initial Assessment Questions 1. COVID-19 DIAGNOSIS: "Who made your COVID-19 diagnosis?" "Was it confirmed by a positive lab test?" If not diagnosed by a HCP, ask "Are there lots of cases (community spread) where you live?" Note: See public health department website, if unsure.    Home test this AM 2. COVID-19 EXPOSURE: "Was there any known exposure to COVID before the symptoms began?" CDC Definition of close contact: within 6 feet (2 meters) for a total of 15 minutes or more over a 24-hour period.      Yes, household 3. ONSET: "When did the COVID-19 symptoms start?"      Last night 4. WORST SYMPTOM: "What is your worst symptom?" (e.g., cough, fever, shortness of breath, muscle aches)    Headache, dizziness 5. COUGH: "Do you have a cough?" If Yes, ask: "How bad is the cough?"       Cough, mild 6. FEVER: "Do you have a fever?" If Yes, ask: "What is your temperature, how was it measured, and when did it start?"     no 7. RESPIRATORY STATUS: "Describe your breathing?" (e.g., shortness of breath,  wheezing, unable to speak)      WNL 8. BETTER-SAME-WORSE: "Are you getting better, staying the same or getting worse compared to yesterday?"  If getting worse, ask, "In what way?"    Dizziness started this AM 9. HIGH RISK DISEASE: "Do you have any chronic medical problems?" (e.g., asthma, heart or lung disease, weak immune system, obesity, etc.)     Obesity. 10. VACCINE: "Have you gotten the COVID-19 vaccine?" If Yes ask: "Which one, how many shots, when did you get it?"       No 12. OTHER SYMPTOMS: "Do you have any other symptoms?"  (e.g., chills, fatigue, headache, loss of smell or taste, muscle pain, sore throat; new loss of smell or taste especially support the diagnosis of COVID-19)      Headache like sinus pressure 7-8/10. IBU ineffective  Protocols used: CORONAVIRUS (COVID-19) DIAGNOSED OR SUSPECTED-A-AH

## 2020-05-07 NOTE — Telephone Encounter (Signed)
If he wants virtual visit, ok to double book 1pm to discuss natural course and treatment options.

## 2020-05-07 NOTE — Telephone Encounter (Signed)
Scheduled for 05/08/20

## 2020-05-08 ENCOUNTER — Other Ambulatory Visit: Payer: Self-pay

## 2020-05-08 ENCOUNTER — Encounter: Payer: Self-pay | Admitting: Adult Health

## 2020-05-08 ENCOUNTER — Telehealth (INDEPENDENT_AMBULATORY_CARE_PROVIDER_SITE_OTHER): Payer: Managed Care, Other (non HMO) | Admitting: Adult Health

## 2020-05-08 DIAGNOSIS — R509 Fever, unspecified: Secondary | ICD-10-CM | POA: Insufficient documentation

## 2020-05-08 DIAGNOSIS — U071 COVID-19: Secondary | ICD-10-CM | POA: Insufficient documentation

## 2020-05-08 DIAGNOSIS — R519 Headache, unspecified: Secondary | ICD-10-CM | POA: Diagnosis not present

## 2020-05-08 NOTE — Progress Notes (Signed)
MyChart Video Visit    Virtual Visit via Video Note   This visit type was conducted due to national recommendations for restrictions regarding the COVID-19 Pandemic (e.g. social distancing) in an effort to limit this patient's exposure and mitigate transmission in our community. This patient is at least at moderate risk for complications without adequate follow up. This format is felt to be most appropriate for this patient at this time. Physical exam was limited by quality of the video and audio technology used for the visit.   Parties involved in visit as below:    Patient location: at home  Provider location: Provider: Provider's office at  [facility], [place].     I discussed the limitations of evaluation and management by telemedicine and the availability of in person appointments. The patient expressed understanding and agreed to proceed.  Patient: George Bennett   DOB: May 03, 1980   40 y.o. Male  MRN: [id number] Visit Date: 05/08/2020  Today's healthcare provider: Jairo Ben, FNP   Chief Complaint  Patient presents with  . Covid Positive   Subjective    HPI HPI    Patient presents toady after testing positive for Covid-19 on 05/07/20, patient reports that on 05/06/20 he began having symptoms of cough, headache and dizziness. Patient reports that he has had a fever high of 102.3, patient that he has been using otc Tylenol as fever reducer.    Last edited by Fonda Kinder, CMA on 05/08/2020 10:37 AM. (History)      Started feeling bad, 05/06/2020. Denies any symptoms prior to that or any recent illness. He reports she had a headache yesterday and is now improved. He has mild nasal congestion.  Denies any shortness of breath.   Temperature was 102.3 this morning. He has not rechecked. Taking every 6 - 8 hours tylenol feels temperature is down now has not retaken.  denies any exposure to strep or sore throat.  Denies any wheezing.   Denies any neck stiffness or any other exposures.  He has another covid positive in his home.   He has not vaccinated.   Patient  denies any body aches, rash, chest pain, shortness of breath , nausea, vomiting, or diarrhea.  Denies  pre syncopal or syncopal episodes.    Patient Active Problem List   Diagnosis Date Noted  . COVID-19 05/08/2020  . Fever 05/08/2020  . Nonintractable headache 05/08/2020  . Morbid obesity (HCC) 11/14/2018  . Milia of eyelids of both eyes 11/14/2018  . Family history of hyperlipidemia 05/24/2015  . Benign hypertension 09/09/2009  . Chronic tension-type headache, not intractable 04/06/2004   Past Medical History:  Diagnosis Date  . Chronic tension type headache   . Hypertension    Allergies  Allergen Reactions  . Darvon  [Propoxyphene]   . Sulfa Antibiotics       Medications: Outpatient Medications Prior to Visit  Medication Sig  . lisinopril (ZESTRIL) 10 MG tablet Take 1 tablet (10 mg total) by mouth daily.  . propranolol ER (INDERAL LA) 60 MG 24 hr capsule TAKE 1 CAPSULE BY MOUTH EVERY DAY  . dicyclomine (BENTYL) 20 MG tablet Take 1 tablet (20 mg total) by mouth 3 (three) times daily before meals. As needed for bloating/diarrhea (Patient not taking: No sig reported)  . Olopatadine HCl 0.2 % SOLN Apply 1 drop to eye daily. (Patient not taking: No sig reported)   No facility-administered medications prior to visit.    Review of Systems  Constitutional: Positive for chills, fatigue and fever. Negative for activity change, appetite change and diaphoresis.  HENT: Positive for postnasal drip, rhinorrhea, sinus pressure and sinus pain. Negative for congestion, ear discharge, ear pain, hearing loss, sneezing, sore throat, tinnitus, trouble swallowing and voice change.   Eyes: Negative.   Respiratory: Positive for cough. Negative for chest tightness and shortness of breath.   Cardiovascular: Negative.   Gastrointestinal: Positive for diarrhea.  Negative for abdominal distention, abdominal pain, anal bleeding, blood in stool, constipation, nausea, rectal pain and vomiting.  Endocrine: Negative.   Genitourinary: Negative.   Musculoskeletal: Positive for arthralgias. Negative for back pain and neck pain.       Generalized body aches reported.   Neurological: Positive for dizziness and headaches. Negative for weakness.  Hematological: Negative.   Psychiatric/Behavioral: Negative.  Negative for confusion.    Last CBC No results found for: WBC, HGB, HCT, MCV, MCH, RDW, PLT Last metabolic panel Lab Results  Component Value Date   GLUCOSE 119 (H) 11/10/2019   NA 138 11/10/2019   K 3.8 11/10/2019   CL 101 11/10/2019   CO2 23 11/10/2019   BUN 18 11/10/2019   CREATININE 1.00 11/10/2019   GFRNONAA 94 11/10/2019   GFRAA 109 11/10/2019   CALCIUM 9.1 11/10/2019   PROT 7.2 11/10/2019   ALBUMIN 4.3 11/10/2019   LABGLOB 2.9 11/10/2019   AGRATIO 1.5 11/10/2019   BILITOT 0.5 11/10/2019   ALKPHOS 111 11/10/2019   AST 20 11/10/2019   ALT 27 11/10/2019      Objective    There were no vitals taken for this visit. BP Readings from Last 3 Encounters:  11/10/19 132/84  11/14/18 116/66  07/14/18 132/88   Wt Readings from Last 3 Encounters:  11/10/19 (!) 327 lb 12.8 oz (148.7 kg)  11/14/18 (!) 332 lb 8 oz (150.8 kg)  08/09/17 (!) 339 lb 6.4 oz (154 kg)      Physical Exam    Patient is alert and oriented and responsive to questions Engages in conversation with provider. Speaks in full sentences without any pauses without any shortness of breath or distress.   Assessment & Plan     COVID-19 - Plan: CBC With Differential, Comprehensive Metabolic Panel (CMET), DG Chest 2 View  Fever, unspecified fever cause - Plan: CBC With Differential, Comprehensive Metabolic Panel (CMET), DG Chest 2 View  Nonintractable headache, unspecified chronicity pattern, unspecified headache type  Orders Placed This Encounter  Procedures  . DG  Chest 2 View    Order Specific Question:   Reason for Exam (SYMPTOM  OR DIAGNOSIS REQUIRED)    Answer:   cough, covid.    Order Specific Question:   Preferred imaging location?    Answer:   [facility]  . CBC With Differential  . Comprehensive Metabolic Panel (CMET)  Given temperature is 102 this morning will go ahead and order a chest x-ray and CBC CMP on an outpatient basis he can go to [facility] to have that done and must wear a mask.  Patient was advised to monitor his temperature, to go without fever reducers for a break to see what his temperature is and report any changes or concerns back to the office.  Patient is advised to hydrate with cold fluids.  His headache is actually better today and he does not report any significant respiratory symptoms.  He has not been vomiting.  We will continue to follow with over-the-counter medications, vitamin C and vitamin D, as well  as zinc are okay per package instructions as well as taking aspirin prophylactically to prevent any Covid related thrombosis.  Red Flags discussed. The patient was given clear instructions to go to ER or return to medical center if any red flags develop, symptoms do not improve, worsen or new problems develop. They verbalized understanding.   Return in about 4 days (around 05/12/2020), or if symptoms worsen or fail to improve, for at any time for any worsening symptoms, Go to Emergency room/ urgent care if worse.     I discussed the assessment and treatment plan with the patient. The patient was provided an opportunity to ask questions and all were answered. The patient agreed with the plan and demonstrated an understanding of the instructions.   The patient was advised to call back or seek an in-person evaluation if the symptoms worsen or if the condition fails to improve as anticipated.  I provided of non-face-to-face time during this encounter.   I discussed the limitations of  evaluation and management by telemedicine and the availability of in person appointments. The patient expressed understanding and agreed to proceed.  The entirety of the information documented in the History of Present Illness, Review of Systems and Physical Exam were personally obtained by me. Portions of this information were initially documented by the CMA and reviewed by me for thoroughness and accuracy.      Jairo Ben, FNP [facility] [phone] (phone) [phone] (fax)  [facility]

## 2020-11-25 ENCOUNTER — Telehealth: Payer: Self-pay | Admitting: Family Medicine

## 2020-11-25 DIAGNOSIS — I10 Essential (primary) hypertension: Secondary | ICD-10-CM

## 2020-11-25 MED ORDER — LISINOPRIL 10 MG PO TABS: 10.0000 mg | ORAL_TABLET | Freq: Every day | ORAL | 0 refills | Status: DC

## 2020-11-25 MED ORDER — PROPRANOLOL HCL ER 60 MG PO CP24: ORAL_CAPSULE | ORAL | 0 refills | Status: DC

## 2020-11-25 NOTE — Telephone Encounter (Signed)
[facility] faxed refill request for the following medications:  lisinopril (ZESTRIL) 10 MG tablet 2.   propranolol ER (INDERAL LA) 60 MG 24 hr capsule  90 day supply Last Rx: 11/10/19 Qty: 90 Refills: 3  LOV: 05/08/20 with [place] Please advise. Thanks TNP

## 2020-12-11 ENCOUNTER — Telehealth (INDEPENDENT_AMBULATORY_CARE_PROVIDER_SITE_OTHER): Payer: Managed Care, Other (non HMO) | Admitting: Family Medicine

## 2020-12-11 DIAGNOSIS — J329 Chronic sinusitis, unspecified: Secondary | ICD-10-CM | POA: Diagnosis not present

## 2020-12-11 MED ORDER — AMOXICILLIN 500 MG PO CAPS
1000.0000 mg | ORAL_CAPSULE | Freq: Three times a day (TID) | ORAL | 0 refills | Status: AC
Start: 2020-12-11 — End: 2020-12-18

## 2020-12-11 NOTE — Progress Notes (Signed)
MyChart Video Visit    Virtual Visit via Video Note   This visit type was conducted due to national recommendations for restrictions regarding the COVID-19 Pandemic (e.g. social distancing) in an effort to limit this patient's exposure and mitigate transmission in our community. This patient is at least at moderate risk for complications without adequate follow up. This format is felt to be most appropriate for this patient at this time. Physical exam was limited by quality of the video and audio technology used for the visit.   Patient location: home Provider location: [facility]  I discussed the limitations of evaluation and management by telemedicine and the availability of in person appointments. The patient expressed understanding and agreed to proceed.  Patient: George Bennett   DOB: 08-25-1980   40 y.o. Male  MRN: [id number] Visit Date: 12/11/2020  Today's healthcare provider: Mila Merry, MD   Chief Complaint  Patient presents with   Sinusitis   Subjective    Sinusitis This is a new problem. The current episode started 1 to 4 weeks ago (Started about two weeks ago.). The problem has been gradually worsening since onset. There has been no fever. Associated symptoms include congestion, coughing, ear pain, headaches, sinus pressure and a sore throat. Pertinent negatives include no hoarse voice, shortness of breath or sneezing. Past treatments include oral decongestants. The treatment provided no relief.   A little bit of itching in eyes and sneezing. Mucinex helped for a little while, but has come back worse this week.    Medications: Outpatient Medications Prior to Visit  Medication Sig   dicyclomine (BENTYL) 20 MG tablet Take 1 tablet (20 mg total) by mouth 3 (three) times daily before meals. As needed for bloating/diarrhea (Patient not taking: No sig reported)   lisinopril (ZESTRIL) 10 MG tablet Take 1 tablet (10 mg total) by mouth daily. Please schedule office visit before  any future refills.   Olopatadine HCl 0.2 % SOLN Apply 1 drop to eye daily. (Patient not taking: No sig reported)   propranolol ER (INDERAL LA) 60 MG 24 hr capsule TAKE 1 CAPSULE BY MOUTH EVERY DAY. Please schedule office visit before any future refills.   No facility-administered medications prior to visit.    Review of Systems  Constitutional: Negative.   HENT:  Positive for congestion, ear pain, sinus pressure, sinus pain and sore throat. Negative for ear discharge, hoarse voice and sneezing.   Respiratory:  Positive for cough and wheezing. Negative for apnea, choking, chest tightness, shortness of breath and stridor.   Gastrointestinal:  Positive for diarrhea (Pt reports diarrhea last week.). Negative for abdominal distention, abdominal pain, anal bleeding, blood in stool, constipation, nausea, rectal pain and vomiting.  Neurological:  Positive for headaches. Negative for dizziness and light-headedness.     Objective    There were no vitals taken for this visit.   Physical Exam   Awake, alert, oriented x 3. In no apparent distress   Assessment & Plan     1. Sinusitis, unspecified chronicity, unspecified location  - amoxicillin (AMOXIL) 500 MG capsule; Take 2 capsules (1,000 mg total) by mouth 3 (three) times daily for 7 days.  Dispense: 42 capsule; Refill: 0        I discussed the assessment and treatment plan with the patient. The patient was provided an opportunity to ask questions and all were answered. The patient agreed with the plan and demonstrated an understanding of the instructions.   The patient was advised to call back  or seek an in-person evaluation if the symptoms worsen or if the condition fails to improve as anticipated.  I provided 9 minutes of non-face-to-face time during this encounter.  The entirety of the information documented in the History of Present Illness, Review of Systems and Physical Exam were personally obtained by me. Portions of this  information were initially documented by the CMA and reviewed by me for thoroughness and accuracy.    Mila Merry, MD [facility] [phone] (phone) [phone] (fax)  [facility]

## 2021-03-05 ENCOUNTER — Other Ambulatory Visit: Payer: Self-pay | Admitting: Family Medicine

## 2021-03-05 DIAGNOSIS — I10 Essential (primary) hypertension: Secondary | ICD-10-CM

## 2021-04-28 ENCOUNTER — Other Ambulatory Visit: Payer: Self-pay

## 2021-04-28 ENCOUNTER — Ambulatory Visit (INDEPENDENT_AMBULATORY_CARE_PROVIDER_SITE_OTHER): Payer: Managed Care, Other (non HMO) | Admitting: Family Medicine

## 2021-04-28 ENCOUNTER — Encounter: Payer: Self-pay | Admitting: Family Medicine

## 2021-04-28 VITALS — BP 135/80 | HR 71 | Resp 16 | Ht 72.0 in | Wt 337.4 lb

## 2021-04-28 DIAGNOSIS — I10 Essential (primary) hypertension: Secondary | ICD-10-CM | POA: Insufficient documentation

## 2021-04-28 DIAGNOSIS — E782 Mixed hyperlipidemia: Secondary | ICD-10-CM | POA: Insufficient documentation

## 2021-04-28 DIAGNOSIS — Z125 Encounter for screening for malignant neoplasm of prostate: Secondary | ICD-10-CM

## 2021-04-28 DIAGNOSIS — R739 Hyperglycemia, unspecified: Secondary | ICD-10-CM

## 2021-04-28 DIAGNOSIS — J0141 Acute recurrent pansinusitis: Secondary | ICD-10-CM | POA: Diagnosis not present

## 2021-04-28 MED ORDER — AMOXICILLIN-POT CLAVULANATE 875-125 MG PO TABS: 1.0000 | ORAL_TABLET | Freq: Two times a day (BID) | ORAL | 0 refills | Status: DC

## 2021-04-28 MED ORDER — LISINOPRIL 10 MG PO TABS: 10.0000 mg | ORAL_TABLET | Freq: Every day | ORAL | 3 refills | Status: DC

## 2021-04-28 MED ORDER — PROPRANOLOL HCL ER 60 MG PO CP24: 60.0000 mg | ORAL_CAPSULE | Freq: Every day | ORAL | 3 refills | Status: DC

## 2021-04-28 NOTE — Assessment & Plan Note (Signed)
Chronic, stable ?Denies CP ?Denies SOB ?Denies DOE ?No LE Edema noted on exam ?Continue medication ?Refills provided ?Seek emergent care if you develop CP, chest pain or chest pressure ? ?

## 2021-04-28 NOTE — Progress Notes (Signed)
Established patient visit   Patient: George KoyanagiDarren Carlyon   DOB: 12/02/1980   41 y.o. Male  MRN: [id number] Visit Date: 04/28/2021  Today's healthcare provider: Jacky KindleElise T Quanita Barona, FNP   Chief Complaint  Patient presents with   Hypertension   Sinusitis    Ongoing pain, pressure, drainage for 10+ days   Subjective    HPI HPI     Sinusitis    Additional comments: Ongoing pain, pressure, drainage for 10+ days      Last edited by Jacky KindlePayne, Isaiah Torok T, FNP on 04/28/2021 10:33 AM.      Hypertension, follow-up  BP Readings from Last 3 Encounters:  04/28/21 135/80  11/10/19 132/84  11/14/18 116/66   Wt Readings from Last 3 Encounters:  04/28/21 (!) 337 lb 6.4 oz (153 kg)  11/10/19 (!) 327 lb 12.8 oz (148.7 kg)  11/14/18 (!) 332 lb 8 oz (150.8 kg)     He was last seen for hypertension 1 years ago.  BP at that visit was 132/84. Management since that visit includes none.  He reports excellent compliance with treatment. He is not having side effects.  He is following a Regular diet. He is not exercising. He does not smoke.  Use of agents associated with hypertension: none.   Outside blood pressures are not checked. Symptoms: No chest pain No chest pressure  No palpitations No syncope  No dyspnea No orthopnea  No paroxysmal nocturnal dyspnea No lower extremity edema   Pertinent labs: Lab Results  Component Value Date   CHOL 154 11/10/2019   HDL 39 (L) 11/10/2019   LDLCALC 81 11/10/2019   TRIG 202 (H) 11/10/2019   CHOLHDL 3.9 11/10/2019   Lab Results  Component Value Date   NA 138 11/10/2019   K 3.8 11/10/2019   CREATININE 1.00 11/10/2019   GFRNONAA 94 11/10/2019   GLUCOSE 119 (H) 11/10/2019   TSH 2.530 08/12/2017     The 10-year ASCVD risk score (Arnett DK, et al., 2019) is: 1.2%   ---------------------------------------------------------------------------------------------------   Medications: Outpatient Medications Prior to Visit  Medication Sig    [DISCONTINUED] lisinopril (ZESTRIL) 10 MG tablet TAKE 1 TABLET (10 MG TOTAL) BY MOUTH DAILY. PLEASE SCHEDULE OFFICE VISIT BEFORE ANY FUTURE REFILLS.   [DISCONTINUED] propranolol ER (INDERAL LA) 60 MG 24 hr capsule TAKE 1 CAPSULE BY MOUTH EVERY DAY. PLEASE SCHEDULE OFFICE VISIT BEFORE ANY FUTURE REFILLS.   [DISCONTINUED] dicyclomine (BENTYL) 20 MG tablet Take 1 tablet (20 mg total) by mouth 3 (three) times daily before meals. As needed for bloating/diarrhea (Patient not taking: Reported on 04/28/2021)   [DISCONTINUED] Olopatadine HCl 0.2 % SOLN Apply 1 drop to eye daily. (Patient not taking: Reported on 04/28/2021)   No facility-administered medications prior to visit.    Review of Systems     Objective    BP 135/80    Pulse 71    Resp 16    Ht 6' (1.829 m)    Wt (!) 337 lb 6.4 oz (153 kg)    SpO2 96%    BMI 45.76 kg/m    Physical Exam Vitals and nursing note reviewed.  Constitutional:      Appearance: Normal appearance. He is obese.  HENT:     Head: Normocephalic and atraumatic.     Jaw: No tenderness or swelling.     Right Ear: Tympanic membrane, ear canal and external ear normal. There is no impacted cerumen.     Left Ear: Tympanic membrane, ear canal and  external ear normal. There is no impacted cerumen.     Nose: Congestion present.     Right Turbinates: Enlarged and swollen.     Left Turbinates: Enlarged and swollen.     Right Sinus: Maxillary sinus tenderness and frontal sinus tenderness present.     Left Sinus: Maxillary sinus tenderness and frontal sinus tenderness present.     Mouth/Throat:     Mouth: Mucous membranes are moist.     Pharynx: Posterior oropharyngeal erythema present.     Tonsils: 1+ on the right. 1+ on the left.  Eyes:     Pupils: Pupils are equal, round, and reactive to light.  Cardiovascular:     Rate and Rhythm: Normal rate and regular rhythm.     Pulses: Normal pulses.     Heart sounds: Normal heart sounds.  Pulmonary:     Effort: Pulmonary effort  is normal.     Breath sounds: Normal breath sounds.  Musculoskeletal:        General: Normal range of motion.     Cervical back: Normal range of motion.  Skin:    General: Skin is warm and dry.     Capillary Refill: Capillary refill takes less than 2 seconds.  Neurological:     General: No focal deficit present.     Mental Status: He is alert and oriented to person, place, and time. Mental status is at baseline.     No results found for any visits on 04/28/21.  Assessment & Plan     Problem List Items Addressed This Visit       Cardiovascular and Mediastinum   Primary hypertension - Primary    Chronic, stable Denies CP Denies SOB Denies DOE No LE Edema noted on exam Continue medication Refills provided Seek emergent care if you develop CP, chest pain or chest pressure       Relevant Medications   lisinopril (ZESTRIL) 10 MG tablet   propranolol ER (INDERAL LA) 60 MG 24 hr capsule   Other Relevant Orders   Comprehensive metabolic panel     Respiratory   Acute recurrent pansinusitis    10-14 day course of illness Sinus pressure, sinus pain, sinus discharge Yellow thick discharge Has tried OTC medication with some relief Continue to have discharge and related cough      Relevant Medications   amoxicillin-clavulanate (AUGMENTIN) 875-125 MG tablet     Other   Morbid obesity (HCC)    Discussed importance of healthy weight management Discussed diet and exercise BMI 45.76      Relevant Orders   Comprehensive metabolic panel   Hemoglobin A1c   Elevated serum glucose    Previous elevated on blood work Repeat chem and A1c today BMI 45.76 Discussed importance of healthy weight management Discussed diet and exercise       Relevant Orders   Hemoglobin A1c   Screening PSA (prostate specific antigen)    Check PSA; no LUTS      Relevant Orders   PSA   Elevated triglycerides with high cholesterol    Repeat lipid testing Likely poor diet given weight  increase BMI 45.76      Relevant Medications   lisinopril (ZESTRIL) 10 MG tablet   propranolol ER (INDERAL LA) 60 MG 24 hr capsule   Other Relevant Orders   Lipid panel     Return in about 6 months (around 10/26/2021) for chonic disease management.       Leilani Merl, FNP, have reviewed all  documentation for this visit. The documentation on 04/28/21 for the exam, diagnosis, procedures, and orders are all accurate and complete.    Jacky Kindle, FNP  [facility] [phone] (phone) [phone] (fax)  [facility]

## 2021-04-28 NOTE — Assessment & Plan Note (Signed)
Check PSA; no LUTS

## 2021-04-28 NOTE — Assessment & Plan Note (Signed)
Previous elevated on blood work Repeat chem and A1c today BMI 45.76 Discussed importance of healthy weight management Discussed diet and exercise

## 2021-04-28 NOTE — Assessment & Plan Note (Signed)
Repeat lipid testing Likely poor diet given weight increase BMI 45.76

## 2021-04-28 NOTE — Assessment & Plan Note (Signed)
10-14 day course of illness Sinus pressure, sinus pain, sinus discharge Yellow thick discharge Has tried OTC medication with some relief Continue to have discharge and related cough

## 2021-04-28 NOTE — Assessment & Plan Note (Signed)
Discussed importance of healthy weight management Discussed diet and exercise BMI 45.76

## 2021-04-29 LAB — COMPREHENSIVE METABOLIC PANEL
ALT: 36 IU/L (ref 0–44)
AST: 25 IU/L (ref 0–40)
Albumin/Globulin Ratio: 1.4 (ref 1.2–2.2)
Albumin: 4.2 g/dL (ref 4.0–5.0)
Alkaline Phosphatase: 92 IU/L (ref 44–121)
BUN/Creatinine Ratio: 18 (ref 9–20)
BUN: 19 mg/dL (ref 6–24)
Bilirubin Total: 0.5 mg/dL (ref 0.0–1.2)
CO2: 26 mmol/L (ref 20–29)
Calcium: 9.2 mg/dL (ref 8.7–10.2)
Chloride: 100 mmol/L (ref 96–106)
Creatinine, Ser: 1.08 mg/dL (ref 0.76–1.27)
Globulin, Total: 3.1 g/dL (ref 1.5–4.5)
Glucose: 99 mg/dL (ref 70–99)
Potassium: 4.2 mmol/L (ref 3.5–5.2)
Sodium: 135 mmol/L (ref 134–144)
Total Protein: 7.3 g/dL (ref 6.0–8.5)
eGFR: 89 mL/min/{1.73_m2} (ref >59)

## 2021-04-29 LAB — HEMOGLOBIN A1C
Est. average glucose Bld gHb Est-mCnc: 97 mg/dL
Hgb A1c MFr Bld: 5 % (ref 4.8–5.6)

## 2021-04-29 LAB — LIPID PANEL
Chol/HDL Ratio: 3.9 ratio (ref 0.0–5.0)
Cholesterol, Total: 150 mg/dL (ref 100–199)
HDL: 38 mg/dL — ABNORMAL LOW (ref >39)
LDL Chol Calc (NIH): 83 mg/dL (ref 0–99)
Triglycerides: 166 mg/dL — ABNORMAL HIGH (ref 0–149)
VLDL Cholesterol Cal: 29 mg/dL (ref 5–40)

## 2021-04-29 LAB — PSA: Prostate Specific Ag, Serum: 1.4 ng/mL (ref 0.0–4.0)

## 2021-10-27 ENCOUNTER — Encounter: Payer: Self-pay | Admitting: Family Medicine

## 2021-10-27 ENCOUNTER — Ambulatory Visit (INDEPENDENT_AMBULATORY_CARE_PROVIDER_SITE_OTHER): Payer: Managed Care, Other (non HMO) | Admitting: Family Medicine

## 2021-10-27 VITALS — BP 124/83 | HR 68 | Temp 98.2°F | Resp 16 | Ht 72.0 in | Wt 332.0 lb

## 2021-10-27 DIAGNOSIS — E782 Mixed hyperlipidemia: Secondary | ICD-10-CM

## 2021-10-27 DIAGNOSIS — M5432 Sciatica, left side: Secondary | ICD-10-CM | POA: Insufficient documentation

## 2021-10-27 DIAGNOSIS — M25552 Pain in left hip: Secondary | ICD-10-CM | POA: Insufficient documentation

## 2021-10-27 DIAGNOSIS — I10 Essential (primary) hypertension: Secondary | ICD-10-CM

## 2021-10-27 MED ORDER — LISINOPRIL 10 MG PO TABS: 10.0000 mg | ORAL_TABLET | Freq: Every day | ORAL | 3 refills | Status: DC

## 2021-10-27 MED ORDER — PROPRANOLOL HCL ER 60 MG PO CP24: 60.0000 mg | ORAL_CAPSULE | Freq: Every day | ORAL | 3 refills | Status: DC

## 2021-10-27 NOTE — Assessment & Plan Note (Signed)
Chronic, improved Has been meal prepping with his g/f who had gastric sleeve 1 month ago Continue to recommend balanced, lower carb meals. Smaller meal size, adding snacks. Choosing water as drink of choice and increasing purposeful exercise. Body mass index is 45.03 kg/m. Discussed importance of healthy weight management Discussed diet and exercise

## 2021-10-27 NOTE — Assessment & Plan Note (Signed)
Previous elevated with low HDL Encourage dietary changes and increase in exercise  Recommend increase in diet of healthier fat choices- low fat meats, oils that are not solid at room temperature, nuts, seeds, fish- cod, halibut, salmon, and avocado. Exercise can also increase this number.  Supplemental omega 3's can be taken as well but are not as helpful as dietary/exercise changes.

## 2021-10-27 NOTE — Assessment & Plan Note (Signed)
Acute, stable; denies pin point pain at this time  May be exacerbated from work d/t excess climbing Declines referral to PT at this time Attachment of exercises provided encourage use of OTC NSAIDs max of 800 mg every 8 hours for 3 days with use of ice

## 2021-10-27 NOTE — Progress Notes (Signed)
Established patient visit  I,Joseline E Rosas,acting as a scribe for Gwyneth Sprout, FNP.,have documented all relevant documentation on the behalf of Gwyneth Sprout, FNP,as directed by  Gwyneth Sprout, FNP while in the presence of Gwyneth Sprout, FNP.   Patient: George Bennett   DOB: 1981/01/21   40 y.o. Male  MRN: [id number] Visit Date: 10/27/2021  Today's healthcare provider: Gwyneth Sprout, FNP  Re Introduced to nurse practitioner role and practice setting.  All questions answered.  Discussed provider/patient relationship and expectations.   Chief Complaint  Patient presents with   follow-up Hypertension   Subjective    HPI   Hypertension, follow-up  BP Readings from Last 3 Encounters:  10/27/21 124/83  04/28/21 135/80  11/10/19 132/84   Wt Readings from Last 3 Encounters:  10/27/21 (!) 332 lb (150.6 kg)  04/28/21 (!) 337 lb 6.4 oz (153 kg)  11/10/19 (!) 327 lb 12.8 oz (148.7 kg)     He was last seen for hypertension 6 months ago.  BP at that visit was 135/80. Management since that visit includes none. He reports good compliance with treatment. He is not having side effects.  He is not exercising.Hi jobs requires a lot of climbing. He is not adherent to low salt diet.   Outside blood pressures are not being checked.  He does not smoke.  Symptoms: No chest pain No chest pressure  No palpitations No syncope  No dyspnea No orthopnea  No paroxysmal nocturnal dyspnea No lower extremity edema   Pertinent labs Lab Results  Component Value Date   CHOL 150 04/28/2021   HDL 38 (L) 04/28/2021   LDLCALC 83 04/28/2021   TRIG 166 (H) 04/28/2021   CHOLHDL 3.9 04/28/2021   Lab Results  Component Value Date   NA 135 04/28/2021   K 4.2 04/28/2021   CREATININE 1.08 04/28/2021   EGFR 89 04/28/2021   GLUCOSE 99 04/28/2021   TSH 2.530 08/12/2017     The 10-year ASCVD risk score (Arnett DK, et al., 2019) is:  1.2%  ---------------------------------------------------------------------------------------------------   Medications: Outpatient Medications Prior to Visit  Medication Sig   [DISCONTINUED] lisinopril (ZESTRIL) 10 MG tablet Take 1 tablet (10 mg total) by mouth daily. Please schedule office visit before any future refills.   [DISCONTINUED] propranolol ER (INDERAL LA) 60 MG 24 hr capsule Take 1 capsule (60 mg total) by mouth daily.   [DISCONTINUED] amoxicillin-clavulanate (AUGMENTIN) 875-125 MG tablet Take 1 tablet by mouth 2 (two) times daily.   No facility-administered medications prior to visit.    Review of Systems  Constitutional:  Positive for fatigue.  Psychiatric/Behavioral:  Positive for sleep disturbance.   All other systems reviewed and are negative.   Last CBC No results found for: "WBC", "HGB", "HCT", "MCV", "MCH", "RDW", "PLT" Last metabolic panel Lab Results  Component Value Date   GLUCOSE 99 04/28/2021   NA 135 04/28/2021   K 4.2 04/28/2021   CL 100 04/28/2021   CO2 26 04/28/2021   BUN 19 04/28/2021   CREATININE 1.08 04/28/2021   EGFR 89 04/28/2021   CALCIUM 9.2 04/28/2021   PROT 7.3 04/28/2021   ALBUMIN 4.2 04/28/2021   LABGLOB 3.1 04/28/2021   AGRATIO 1.4 04/28/2021   BILITOT 0.5 04/28/2021   ALKPHOS 92 04/28/2021   AST 25 04/28/2021   ALT 36 04/28/2021   Last lipids Lab Results  Component Value Date   CHOL 150 04/28/2021   HDL 38 (L) 04/28/2021  LDLCALC 83 04/28/2021   TRIG 166 (H) 04/28/2021   CHOLHDL 3.9 04/28/2021   Last hemoglobin A1c Lab Results  Component Value Date   HGBA1C 5.0 04/28/2021   Last thyroid functions Lab Results  Component Value Date   TSH 2.530 08/12/2017       Objective    BP 124/83 (BP Location: Left Arm, Patient Position: Sitting, Cuff Size: Large)   Pulse 68   Temp 98.2 F (36.8 C) (Oral)   Resp 16   Ht 6' (1.829 m)   Wt (!) 332 lb (150.6 kg)   BMI 45.03 kg/m  BP Readings from Last 3 Encounters:   10/27/21 124/83  04/28/21 135/80  11/10/19 132/84   Wt Readings from Last 3 Encounters:  10/27/21 (!) 332 lb (150.6 kg)  04/28/21 (!) 337 lb 6.4 oz (153 kg)  11/10/19 (!) 327 lb 12.8 oz (148.7 kg)   SpO2 Readings from Last 3 Encounters:  04/28/21 96%  11/10/19 96%  11/14/18 96%      Physical Exam Vitals and nursing note reviewed.  Constitutional:      Appearance: Normal appearance. He is obese.  HENT:     Head: Normocephalic and atraumatic.  Eyes:     Pupils: Pupils are equal, round, and reactive to light.  Cardiovascular:     Rate and Rhythm: Normal rate and regular rhythm.     Pulses: Normal pulses.     Heart sounds: Normal heart sounds.  Pulmonary:     Effort: Pulmonary effort is normal.     Breath sounds: Normal breath sounds.  Musculoskeletal:        General: Normal range of motion.     Cervical back: Normal range of motion.  Skin:    General: Skin is warm and dry.     Capillary Refill: Capillary refill takes less than 2 seconds.  Neurological:     General: No focal deficit present.     Mental Status: He is alert and oriented to person, place, and time. Mental status is at baseline.  Psychiatric:        Mood and Affect: Mood normal.        Behavior: Behavior normal.        Thought Content: Thought content normal.        Judgment: Judgment normal.      No results found for any visits on 10/27/21.  Assessment & Plan     Problem List Items Addressed This Visit       Cardiovascular and Mediastinum   Primary hypertension - Primary    Chronic, stable; goal of <140/<90 or as needed for employment with [employer] Denies CP Denies SOB/ DOE Denies low blood pressure/hypotension Denies vision changes No LE Edema noted on exam Continue medication, Lisinopril 10 mg, Propranolol 60 mg Denies side effects RTC 1 year for CPE Seek emergent care if you develop chest pain or chest pressure       Relevant Medications   lisinopril (ZESTRIL) 10 MG tablet    propranolol ER (INDERAL LA) 60 MG 24 hr capsule     Nervous and Auditory   Sciatica of left side    Acute, stable; denies pin point pain at this time  May be exacerbated from work d/t excess climbing Declines referral to PT at this time Attachment of exercises provided encourage use of OTC NSAIDs max of 800 mg every 8 hours for 3 days with use of ice         Other     Elevated triglycerides with high cholesterol    Previous elevated with low HDL Encourage dietary changes and increase in exercise  Recommend increase in diet of healthier fat choices- low fat meats, oils that are not solid at room temperature, nuts, seeds, fish- cod, halibut, salmon, and avocado. Exercise can also increase this number.  Supplemental omega 3's can be taken as well but are not as helpful as dietary/exercise changes.       Relevant Medications   lisinopril (ZESTRIL) 10 MG tablet   propranolol ER (INDERAL LA) 60 MG 24 hr capsule   Left hip pain    Acute on chronic, hx of muscle tear Encourage strength training exercises by alternating starting leg when climbing Seek f/u with PT/Ortho if needed       Morbid obesity (HCC)    Chronic, improved Has been meal prepping with his g/f who had gastric sleeve 1 month ago Continue to recommend balanced, lower carb meals. Smaller meal size, adding snacks. Choosing water as drink of choice and increasing purposeful exercise. Body mass index is 45.03 kg/m. Discussed importance of healthy weight management Discussed diet and exercise         Return in about 1 year (around 10/28/2022) for annual examination.      I, Elise T Payne, FNP, have reviewed all documentation for this visit. The documentation on 10/27/21 for the exam, diagnosis, procedures, and orders are all accurate and complete.    Elise T Payne, FNP  [facility] [phone] (phone) [phone] (fax)  [facility]  

## 2021-10-27 NOTE — Assessment & Plan Note (Signed)
Chronic, stable; goal of <140/<90 or as needed for employment with [employer] Denies CP Denies SOB/ DOE Denies low blood pressure/hypotension Denies vision changes No LE Edema noted on exam Continue medication, Lisinopril 10 mg, Propranolol 60 mg Denies side effects RTC 1 year for CPE Seek emergent care if you develop chest pain or chest pressure

## 2021-10-27 NOTE — Assessment & Plan Note (Signed)
Acute on chronic, hx of muscle tear Encourage strength training exercises by alternating starting leg when climbing Seek f/u with PT/Ortho if needed

## 2022-08-01 ENCOUNTER — Ambulatory Visit
Admission: RE | Admit: 2022-08-01 | Discharge: 2022-08-01 | Disposition: A | Discharge status: Home / Self Care | Payer: Managed Care, Other (non HMO) | Source: Ambulatory Visit | Attending: Urgent Care | Admitting: Urgent Care

## 2022-08-01 VITALS — BP 123/83 | HR 72 | Temp 98.1°F | Resp 16

## 2022-08-01 DIAGNOSIS — J020 Streptococcal pharyngitis: Secondary | ICD-10-CM | POA: Diagnosis not present

## 2022-08-01 LAB — POCT RAPID STREP A (OFFICE): Rapid Strep A Screen: POSITIVE — AB

## 2022-08-01 MED ORDER — AMOXICILLIN 500 MG PO CAPS: 500.0000 mg | ORAL_CAPSULE | Freq: Two times a day (BID) | ORAL | 0 refills | Status: AC

## 2022-08-01 NOTE — ED Provider Notes (Signed)
Renaldo Fiddler    CSN: [id number] Arrival date & time: 08/01/22  1142      History   Chief Complaint Chief Complaint  Patient presents with   Sore Throat    Entered by patient    HPI Glenford Garis is a 42 y.o. male.    Sore Throat    Patient presents to urgent care with complaint of sinus drainage and sore throat for 3 days.  Past Medical History:  Diagnosis Date   Chronic tension type headache    Hypertension     Patient Active Problem List   Diagnosis Date Noted   Left hip pain 10/27/2021   Sciatica of left side 10/27/2021   Elevated serum glucose 04/28/2021   Screening PSA (prostate specific antigen) 04/28/2021   Elevated triglycerides with high cholesterol 04/28/2021   Primary hypertension 04/28/2021   Morbid obesity (HCC) 11/14/2018    Past Surgical History:  Procedure Laterality Date   NO PAST SURGERIES         Home Medications    Prior to Admission medications   Medication Sig Start Date End Date Taking? Authorizing Provider  lisinopril (ZESTRIL) 10 MG tablet Take 1 tablet (10 mg total) by mouth daily. 10/27/21   Jacky Kindle, FNP  propranolol ER (INDERAL LA) 60 MG 24 hr capsule Take 1 capsule (60 mg total) by mouth daily. 10/27/21   Jacky Kindle, FNP    Family History Family History  Problem Relation Age of Onset   Diabetes Father        type 2    Social History Social History   Tobacco Use   Smoking status: Never   Smokeless tobacco: Never  Substance Use Topics   Alcohol use: No    Alcohol/week: 0.0 standard drinks of alcohol   Drug use: No     Allergies   Darvon  [propoxyphene] and Sulfa antibiotics   Review of Systems Review of Systems   Physical Exam Triage Vital Signs ED Triage Vitals [08/01/22 1213]  Enc Vitals Group     BP 123/83     Pulse Rate 72     Resp 16     Temp 98.1 F (36.7 C)     Temp Source Temporal     SpO2 97 %     Weight      Height      Head Circumference      Peak Flow       Pain Score 3     Pain Loc      Pain Edu?      Excl. in GC?    No data found.  Updated Vital Signs BP 123/83 (BP Location: Left Arm)   Pulse 72   Temp 98.1 F (36.7 C) (Temporal)   Resp 16   SpO2 97%   Visual Acuity Right Eye Distance:   Left Eye Distance:   Bilateral Distance:    Right Eye Near:   Left Eye Near:    Bilateral Near:     Physical Exam Vitals reviewed.  Constitutional:      Appearance: He is well-developed.  Skin:    General: Skin is warm and dry.  Neurological:     General: No focal deficit present.     Mental Status: He is alert and oriented to person, place, and time.  Psychiatric:        Mood and Affect: Mood normal.        Behavior: Behavior normal.  UC Treatments / Results  Labs (all labs ordered are listed, but only abnormal results are displayed) Labs Reviewed - No data to display  EKG   Radiology No results found.  Procedures Procedures (including critical care time)  Medications Ordered in UC Medications - No data to display  Initial Impression / Assessment and Plan / UC Course  I have reviewed the triage vital signs and the nursing notes.  Pertinent labs & imaging results that were available during my care of the patient were reviewed by me and considered in my medical decision making (see chart for details).   Harlem Bartow is a 42 y.o. male presenting with sore throat. Patient is afebrile without recent antipyretics, satting well on room air. Overall is well appearing and non-toxic, well hydrated, without respiratory distress.  Pharyngeal erythema is present.  No peritonsillar exudates.  Rapid strep is positive.  Treating with amoxicillin.  Supportive care and use of OTC medication for symptom control is recommended.  Counseled patient on potential for adverse effects with medications prescribed/recommended today, ER and return-to-clinic precautions discussed, patient verbalized understanding and agreement with care  plan.   Final Clinical Impressions(s) / UC Diagnoses   Final diagnoses:  None   Discharge Instructions   None    ED Prescriptions   None    PDMP not reviewed this encounter.   Charma Igo, [place] 08/01/22 1218

## 2022-08-01 NOTE — ED Triage Notes (Signed)
Patient presents to UC for sinus drainage and sore throat x 3 days. Neck more tender. Taking sudafed helped a little.

## 2022-08-01 NOTE — Discharge Instructions (Signed)
Follow up here or with your primary care provider if your symptoms are worsening or not improving with treatment.     

## 2022-11-09 ENCOUNTER — Encounter: Payer: Self-pay | Admitting: Family Medicine

## 2022-11-09 ENCOUNTER — Ambulatory Visit (INDEPENDENT_AMBULATORY_CARE_PROVIDER_SITE_OTHER): Payer: Managed Care, Other (non HMO) | Admitting: Family Medicine

## 2022-11-09 VITALS — BP 131/80 | HR 65 | Temp 98.2°F | Ht 72.0 in | Wt 330.1 lb

## 2022-11-09 DIAGNOSIS — I10 Essential (primary) hypertension: Secondary | ICD-10-CM

## 2022-11-09 DIAGNOSIS — R739 Hyperglycemia, unspecified: Secondary | ICD-10-CM | POA: Diagnosis not present

## 2022-11-09 DIAGNOSIS — E291 Testicular hypofunction: Secondary | ICD-10-CM | POA: Insufficient documentation

## 2022-11-09 DIAGNOSIS — E782 Mixed hyperlipidemia: Secondary | ICD-10-CM

## 2022-11-09 MED ORDER — LISINOPRIL 10 MG PO TABS
10.0000 mg | ORAL_TABLET | Freq: Every day | ORAL | 3 refills | Status: AC
Start: 2022-11-09 — End: ?

## 2022-11-09 MED ORDER — PROPRANOLOL HCL ER 60 MG PO CP24
60.0000 mg | ORAL_CAPSULE | Freq: Every day | ORAL | 3 refills | Status: AC
Start: 2022-11-09 — End: ?

## 2022-11-09 NOTE — Progress Notes (Signed)
Established patient visit   Patient: George Bennett   DOB: 1981/03/14   42 y.o. Male  MRN: [id number] Visit Date: 11/09/2022  Today's healthcare provider: Jacky Kindle, FNP  Introduced to nurse practitioner role and practice setting.  All questions answered.  Discussed provider/patient relationship and expectations.  Subjective    HPI HPI     Medical Management of Chronic Issues    Additional comments: Patient here to monitor Bp      Last edited by Rolly Salter, CMA on 11/09/2022  8:47 AM.      Medications: Outpatient Medications Prior to Visit  Medication Sig   [DISCONTINUED] lisinopril (ZESTRIL) 10 MG tablet Take 1 tablet (10 mg total) by mouth daily.   [DISCONTINUED] propranolol ER (INDERAL LA) 60 MG 24 hr capsule Take 1 capsule (60 mg total) by mouth daily.   No facility-administered medications prior to visit.    Review of Systems  Last CBC No results found for: "WBC", "HGB", "HCT", "MCV", "MCH", "RDW", "PLT" Last metabolic panel Lab Results  Component Value Date   GLUCOSE 99 04/28/2021   NA 135 04/28/2021   K 4.2 04/28/2021   CL 100 04/28/2021   CO2 26 04/28/2021   BUN 19 04/28/2021   CREATININE 1.08 04/28/2021   EGFR 89 04/28/2021   CALCIUM 9.2 04/28/2021   PROT 7.3 04/28/2021   ALBUMIN 4.2 04/28/2021   LABGLOB 3.1 04/28/2021   AGRATIO 1.4 04/28/2021   BILITOT 0.5 04/28/2021   ALKPHOS 92 04/28/2021   AST 25 04/28/2021   ALT 36 04/28/2021   Last lipids Lab Results  Component Value Date   CHOL 150 04/28/2021   HDL 38 (L) 04/28/2021   LDLCALC 83 04/28/2021   TRIG 166 (H) 04/28/2021   CHOLHDL 3.9 04/28/2021   Last hemoglobin A1c Lab Results  Component Value Date   HGBA1C 5.0 04/28/2021   Last thyroid functions Lab Results  Component Value Date   TSH 2.530 08/12/2017      Objective    BP 131/80 (BP Location: Left Arm, Patient Position: Sitting, Cuff Size: Large)   Pulse 65   Temp 98.2 F (36.8 C) (Oral)   Ht 6' (1.829 m)    Wt (!) 330 lb 1.6 oz (149.7 kg)   SpO2 99%   BMI 44.77 kg/m   BP Readings from Last 3 Encounters:  11/09/22 131/80  08/01/22 123/83  10/27/21 124/83   Wt Readings from Last 3 Encounters:  11/09/22 (!) 330 lb 1.6 oz (149.7 kg)  10/27/21 (!) 332 lb (150.6 kg)  04/28/21 (!) 337 lb 6.4 oz (153 kg)   Physical Exam Vitals and nursing note reviewed.  Constitutional:      General: He is awake. He is not in acute distress.    Appearance: Normal appearance. He is well-developed and well-groomed. He is obese. He is not ill-appearing, toxic-appearing or diaphoretic.  HENT:     Head: Normocephalic and atraumatic.     Jaw: There is normal jaw occlusion. No trismus, tenderness, swelling or pain on movement.     Salivary Glands: Right salivary gland is not diffusely enlarged or tender. Left salivary gland is not diffusely enlarged or tender.     Right Ear: Hearing, tympanic membrane, ear canal and external ear normal. There is no impacted cerumen.     Left Ear: Hearing, tympanic membrane, ear canal and external ear normal. There is no impacted cerumen.     Nose: Nose normal. No congestion or rhinorrhea.  Right Turbinates: Not enlarged, swollen or pale.     Left Turbinates: Not enlarged, swollen or pale.     Right Sinus: No maxillary sinus tenderness or frontal sinus tenderness.     Left Sinus: No maxillary sinus tenderness or frontal sinus tenderness.     Mouth/Throat:     Lips: Pink.     Mouth: Mucous membranes are moist. No injury, lacerations, oral lesions or angioedema.     Pharynx: Oropharynx is clear. Uvula midline. No pharyngeal swelling, oropharyngeal exudate or posterior oropharyngeal erythema.     Tonsils: No tonsillar exudate or tonsillar abscesses.  Eyes:     General: Lids are normal. Vision grossly intact. Gaze aligned appropriately.        Right eye: No discharge.        Left eye: No discharge.     Extraocular Movements: Extraocular movements intact.     Conjunctiva/sclera:  Conjunctivae normal.     Pupils: Pupils are equal, round, and reactive to light.  Neck:     Thyroid: No thyroid mass, thyromegaly or thyroid tenderness.     Vascular: No carotid bruit.     Trachea: Trachea normal. No tracheal tenderness.  Cardiovascular:     Rate and Rhythm: Normal rate and regular rhythm.     Pulses: Normal pulses.          Carotid pulses are 2+ on the right side and 2+ on the left side.      Radial pulses are 2+ on the right side and 2+ on the left side.       Femoral pulses are 2+ on the right side and 2+ on the left side.      Popliteal pulses are 2+ on the right side and 2+ on the left side.       Dorsalis pedis pulses are 2+ on the right side and 2+ on the left side.       Posterior tibial pulses are 2+ on the right side and 2+ on the left side.     Heart sounds: Normal heart sounds, S1 normal and S2 normal. No murmur heard.    No friction rub. No gallop.  Pulmonary:     Effort: Pulmonary effort is normal. No respiratory distress.     Breath sounds: Normal breath sounds and air entry. No stridor. No wheezing, rhonchi or rales.  Chest:     Chest wall: No tenderness.  Abdominal:     General: Abdomen is flat. Bowel sounds are normal. There is no distension.     Palpations: Abdomen is soft. There is no mass.     Tenderness: There is no abdominal tenderness. There is no guarding or rebound.     Hernia: No hernia is present.  Genitourinary:    Comments: Exam deferred; denies complaints Musculoskeletal:        General: No swelling, tenderness, deformity or signs of injury. Normal range of motion.     Cervical back: Normal range of motion and neck supple. No rigidity or tenderness.     Right lower leg: No edema.     Left lower leg: No edema.  Lymphadenopathy:     Cervical: No cervical adenopathy.     Right cervical: No superficial, deep or posterior cervical adenopathy.    Left cervical: No superficial, deep or posterior cervical adenopathy.  Skin:    General:  Skin is warm and dry.     Capillary Refill: Capillary refill takes less than 2 seconds.  Coloration: Skin is not jaundiced or pale.     Findings: No bruising, erythema, lesion or rash.  Neurological:     General: No focal deficit present.     Mental Status: He is alert and oriented to person, place, and time. Mental status is at baseline.     GCS: GCS eye subscore is 4. GCS verbal subscore is 5. GCS motor subscore is 6.     Sensory: Sensation is intact. No sensory deficit.     Motor: Motor function is intact. No weakness.     Coordination: Coordination is intact.     Gait: Gait is intact.  Psychiatric:        Attention and Perception: Attention and perception normal.        Mood and Affect: Mood and affect normal.        Speech: Speech normal.        Behavior: Behavior normal. Behavior is cooperative.        Thought Content: Thought content normal.        Cognition and Memory: Cognition normal.        Judgment: Judgment normal.     No results found for any visits on 11/09/22.  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)      Relevant Orders   Testosterone,Free and Total   Prolactin     Other   Elevated serum glucose    Repeat A1c given BMI Discussed importance of healthy weight management Discussed diet and exercise       Relevant Orders   Comprehensive Metabolic Panel (CMET)   Hemoglobin A1c    Elevated triglycerides with high cholesterol    Chronic, self limiting Repeat LP recommend diet low in saturated fat and regular exercise - 30 min at least 5 times per week The 10-year ASCVD risk score (Arnett DK, et al., 2019) is: 1.5%       Relevant Medications   lisinopril (ZESTRIL) 10 MG tablet   propranolol ER (INDERAL LA) 60 MG 24 hr capsule   Other Relevant Orders   Lipid panel   Morbid obesity (HCC)    Chronic, stable Discussed importance of healthy weight management Discussed diet and exercise Body mass index is 44.77 kg/m.       Relevant Orders   CBC with Differential/Platelet   Comprehensive Metabolic Panel (CMET)   TSH   Lipid panel   Hemoglobin A1c   Return in about 1 year (around 11/09/2023) for annual examination.     Leilani Merl, FNP, have reviewed all documentation for this visit. The documentation on 11/09/22 for the exam, diagnosis, procedures, and orders are all accurate and complete.  Jacky Kindle, FNP  [facility] [phone] (phone) [phone] (fax)  [facility]

## 2022-11-09 NOTE — Assessment & Plan Note (Signed)
Chronic, stable Continue dual treatment with ACEi and BB Refills provided Repeat CBC, CMP, TSH

## 2022-11-09 NOTE — Assessment & Plan Note (Signed)
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

## 2022-11-09 NOTE — Assessment & Plan Note (Signed)
Chronic, stable Discussed importance of healthy weight management Discussed diet and exercise Body mass index is 44.77 kg/m.

## 2022-11-09 NOTE — Assessment & Plan Note (Signed)
Repeat A1c given BMI Discussed importance of healthy weight management Discussed diet and exercise

## 2022-11-09 NOTE — Assessment & Plan Note (Signed)
Chronic, self limiting Repeat LP recommend diet low in saturated fat and regular exercise - 30 min at least 5 times per week The 10-year ASCVD risk score (Arnett DK, et al., 2019) is: 1.5%

## 2022-11-10 LAB — HEMOGLOBIN A1C
Est. average glucose Bld gHb Est-mCnc: 97 mg/dL
Hgb A1c MFr Bld: 5 % (ref 4.8–5.6)

## 2022-11-10 LAB — TSH: TSH: 1.63 u[IU]/mL (ref 0.450–4.500)

## 2023-07-30 ENCOUNTER — Encounter: Payer: Self-pay | Admitting: Podiatry

## 2023-07-30 ENCOUNTER — Ambulatory Visit (INDEPENDENT_AMBULATORY_CARE_PROVIDER_SITE_OTHER)

## 2023-07-30 ENCOUNTER — Ambulatory Visit (INDEPENDENT_AMBULATORY_CARE_PROVIDER_SITE_OTHER): Admitting: Podiatry

## 2023-07-30 DIAGNOSIS — M722 Plantar fascial fibromatosis: Secondary | ICD-10-CM

## 2023-07-31 DIAGNOSIS — M722 Plantar fascial fibromatosis: Secondary | ICD-10-CM | POA: Diagnosis not present

## 2023-07-31 MED ORDER — MELOXICAM 15 MG PO TABS: 15.0000 mg | ORAL_TABLET | Freq: Every day | ORAL | 1 refills | Status: DC

## 2023-07-31 MED ORDER — METHYLPREDNISOLONE 4 MG PO TBPK: ORAL_TABLET | ORAL | 0 refills | Status: DC

## 2023-07-31 MED ORDER — BETAMETHASONE SOD PHOS & ACET 6 (3-3) MG/ML IJ SUSP
3.0000 mg | Freq: Once | INTRAMUSCULAR | Status: AC
Start: 2023-07-31 — End: 2023-07-31
  Administered 2023-07-31: 3 mg via INTRA_ARTICULAR

## 2023-07-31 NOTE — Progress Notes (Signed)
   Chief Complaint  Patient presents with   Foot Pain    "My heel hurts." N - heel pain L - plantar, lateral and medial heel left D - 10 years O - slowly gotten worse C - burns, tender A - walking, standing, barefoot T - try to wear good shoes - Vaughn Georges, [employer], Wrap; ice, Went to doctor where I work - gave me a steroid    Subjective: 43 y.o. male presenting today as a new patient for evaluation of left heel pain.  Progressive over the last 10 years.  He has tried different shoe gear modifications with minimal relief.  He recently went to the doctor where prednisone pack was prescribed and he felt somewhat better however the pain continues.   Past Medical History:  Diagnosis Date   Chronic tension type headache    Hypertension    Past Surgical History:  Procedure Laterality Date   NO PAST SURGERIES     Allergies  Allergen Reactions   Darvon  [Propoxyphene]    Sulfa Antibiotics      Objective: Physical Exam General: The patient is alert and oriented x3 in no acute distress.  Dermatology: Skin is warm, dry and supple bilateral lower extremities. Negative for open lesions or macerations bilateral.   Vascular: Dorsalis Pedis and Posterior Tibial pulses palpable bilateral.  Capillary fill time is immediate to all digits.  Neurological: Epicritic and protective threshold intact bilateral.   Musculoskeletal: Tenderness to palpation to the plantar aspect of the left heel along the plantar fascia. All other joints range of motion within normal limits bilateral. Strength 5/5 in all groups bilateral.   Radiographic exam LT foot 07/30/2023: Normal osseous mineralization. Joint spaces preserved. No fracture/dislocation/boney destruction. No other soft tissue abnormalities or radiopaque foreign bodies.   Assessment: 1. Plantar fasciitis left foot  Plan of Care:  -Patient evaluated. Xrays reviewed.   -Injection of 0.5cc Celestone soluspan injected into the left plantar fascia.   -Prescription for Medrol Dosepak -Prescription for meloxicam  15 mg daily after completion of the Dosepak -Recommend OTC prefabricated insoles and arch supports.  Refrain from going barefoot -Return to clinic 4 weeks   Dot Gazella, DPM [facility]  Dr. Dot Gazella, DPM    [address] [place], [place] [address]                Office [phone]  Fax [phone]

## 2023-08-27 ENCOUNTER — Ambulatory Visit (INDEPENDENT_AMBULATORY_CARE_PROVIDER_SITE_OTHER): Admitting: Podiatry

## 2023-08-27 DIAGNOSIS — M722 Plantar fascial fibromatosis: Secondary | ICD-10-CM

## 2023-08-27 NOTE — Progress Notes (Signed)
   Chief Complaint  Patient presents with   Plantar Fasciitis    Pt presents for a follow up of pf of left foot states he is doing better.    Subjective: 43 y.o. male presenting today for follow-up evaluation of plantar fasciitis to the left foot.  No pain.  He says he is doing significantly better.  He has been wearing the arch supports as instructed.  Overall very satisfied  Past Medical History:  Diagnosis Date   Chronic tension type headache    Hypertension    Past Surgical History:  Procedure Laterality Date   NO PAST SURGERIES     Allergies  Allergen Reactions   Darvon  [Propoxyphene]    Sulfa Antibiotics      Objective: Physical Exam General: The patient is alert and oriented x3 in no acute distress.  Dermatology: Skin is warm, dry and supple bilateral lower extremities. Negative for open lesions or macerations bilateral.   Vascular: Dorsalis Pedis and Posterior Tibial pulses palpable bilateral.  Capillary fill time is immediate to all digits.  Neurological: Epicritic and protective threshold intact bilateral.   Musculoskeletal: Negative for any appreciable tenderness to palpation to the plantar aspect of the left heel along the plantar fascia. All other joints range of motion within normal limits bilateral. Strength 5/5 in all groups bilateral.   Radiographic exam LT foot 07/30/2023: Normal osseous mineralization. Joint spaces preserved. No fracture/dislocation/boney destruction. No other soft tissue abnormalities or radiopaque foreign bodies.   Assessment: 1. Plantar fasciitis left foot  Plan of Care:  -Patient evaluated.  -No injection -Continue meloxicam  PRN -Continue OTC power step insoles.  Advised against going barefoot -Return to clinic as needed  *Truck driver for Philomena Breath, DPM [facility]  Dr. Dot Gazella, DPM    [address] [place], [place] [address]                Office  [phone]  Fax [phone]

## 2023-08-27 NOTE — Progress Notes (Signed)
 George Bennett

## 2023-11-08 ENCOUNTER — Ambulatory Visit: Payer: Managed Care, Other (non HMO) | Admitting: Family Medicine

## 2023-12-21 ENCOUNTER — Other Ambulatory Visit: Payer: Self-pay | Admitting: Podiatry
